# Patient Record
Sex: Female | Born: 1965 | Race: Black or African American | Hispanic: No | Marital: Single | Smoking: Never smoker
Health system: Southern US, Community
[De-identification: ages and names within clinical notes are randomized; demographics above are authoritative.]

## PROBLEM LIST (undated history)

## (undated) DIAGNOSIS — M5116 Intervertebral disc disorders with radiculopathy, lumbar region: Secondary | ICD-10-CM

## (undated) DIAGNOSIS — S83209A Unspecified tear of unspecified meniscus, current injury, unspecified knee, initial encounter: Secondary | ICD-10-CM

## (undated) DIAGNOSIS — M1990 Unspecified osteoarthritis, unspecified site: Secondary | ICD-10-CM

## (undated) DIAGNOSIS — Z8719 Personal history of other diseases of the digestive system: Secondary | ICD-10-CM

## (undated) DIAGNOSIS — M722 Plantar fascial fibromatosis: Secondary | ICD-10-CM

## (undated) HISTORY — PX: LAPAROSCOPIC GASTRIC BANDING: SHX1100

## (undated) HISTORY — DX: Plantar fascial fibromatosis: M72.2

## (undated) HISTORY — DX: Unspecified osteoarthritis, unspecified site: M19.90

---

## 2000-08-09 HISTORY — PX: TUBAL LIGATION: SHX77

## 2001-07-25 ENCOUNTER — Encounter: Admission: RE | Admit: 2001-07-25 | Discharge: 2001-10-23 | Payer: Self-pay | Admitting: Orthopedic Surgery

## 2005-08-09 HISTORY — PX: TOTAL ABDOMINAL HYSTERECTOMY W/ BILATERAL SALPINGOOPHORECTOMY: SHX83

## 2007-08-10 HISTORY — PX: CHOLECYSTECTOMY: SHX55

## 2008-08-09 HISTORY — PX: APPENDECTOMY: SHX54

## 2010-04-09 ENCOUNTER — Encounter: Admission: RE | Admit: 2010-04-09 | Discharge: 2010-04-09 | Payer: Self-pay | Admitting: Internal Medicine

## 2011-12-10 ENCOUNTER — Encounter (INDEPENDENT_AMBULATORY_CARE_PROVIDER_SITE_OTHER): Payer: Self-pay | Admitting: General Surgery

## 2011-12-10 ENCOUNTER — Ambulatory Visit (INDEPENDENT_AMBULATORY_CARE_PROVIDER_SITE_OTHER): Payer: PRIVATE HEALTH INSURANCE | Admitting: General Surgery

## 2011-12-10 VITALS — BP 166/92 | HR 92 | Temp 97.4°F | Resp 20 | Ht 67.0 in | Wt 346.2 lb

## 2011-12-10 DIAGNOSIS — Z9884 Bariatric surgery status: Secondary | ICD-10-CM | POA: Insufficient documentation

## 2011-12-10 NOTE — Progress Notes (Signed)
Patient ID: Lauren Keith, female   DOB: Oct 03, 1965, 47 y.o.   MRN: [id number]  Chief Complaint  Patient presents with  . Lap Band Fill    new pt- transfer of care    HPI Lauren Keith is a 46 y.o. female.   HPI 46 year old morbidly obese African American female comes in to transfer her bariatric care from [facility]. She states that is just very inconvenient to drive back and forth to [facility]. She states that they also only do adjustments on Tuesdays which is not conducive to her schedule.  The patient underwent laparoscopic adjustable gastric band placement in September 2008 by Dr. Augustine Radar. She had an AP L. band placed. She apparently lost a fair amount of weight very quickly. It became difficult to aspirate her port; therefore, her port was removed, replaced and relocated to her left upper quadrant. Her preoperative weight was 356 pounds. She apparently lost up to 50 pounds initially. She started having some swallowing problems in July 2011. Her band was deflated. Reportedly an upper GI was within normal limits. Her band was slowly refilled. One clinic note from October 2011 revealed a weight of 313 pounds. Her last adjustment was at that time. She had around 8 cc in her band. She was last seen by Dr. Augustine Radar in January 2013. At that time she had regained a large amount of weight. Her weight at that visit was 336 pounds.  She states that she has no restrictions currently. She denies any heartburn or reflux. She denies any morning or nighttime cough. She will only regurgitate if she eats too fast. She has problems with certain foods such as beef or dry chicken. She is taking a multivitamin on a daily basis. She is currently not engaging in any form of exercise. She attributes this to bilateral knee pain and bilateral plantar fasciitis. She reports having a bowel movement every day. She has also been having some intermittent left-sided abdominal wall pain. This generally occurs about once a  week. It lasts for about 3-4 minutes. She describes it as sharp.  She states that she has been making some poor food choices. She continues to drink [facility] and eats a large amount of carbohydrates. Past Medical History  Diagnosis Date  . Arthritis   . Hypertension   . Sleep apnea   . Plantar fasciitis, bilateral   . Knee pain     bilateral    Past Surgical History  Procedure Date  . Abdominal hysterectomy   . Cholecystectomy   . Appendectomy   . Laparoscopic gastric banding 04/2007    APL; [facility]  . Lap band port revision 2009    [facility]    Family History  Problem Relation Age of Onset  . Hypertension Mother   . Diabetes Mother   . Cancer Mother     breast  . Heart disease Father   . Hypertension Father   . Hypertension Brother   . Cancer Paternal Grandmother     ovarian    Social History History  Substance Use Topics  . Smoking status: Never Smoker   . Smokeless tobacco: Not on file  . Alcohol Use: Yes    No Known Allergies  Current Outpatient Prescriptions  Medication Sig Dispense Refill  . ALPRAZolam (XANAX) 1 MG tablet Take 1 mg by mouth at bedtime as needed.      . Diclofenac-Misoprostol 75-0.2 MG TBEC Take by mouth.      . DULoxetine (CYMBALTA) 60 MG capsule  Take 60 mg by mouth daily.      Marland Kitchen ibuprofen (ADVIL,MOTRIN) 600 MG tablet Take 600 mg by mouth every 6 (six) hours as needed.      Marland Kitchen lisinopril-hydrochlorothiazide (PRINZIDE,ZESTORETIC) 20-25 MG per tablet Take 1 tablet by mouth daily.      . Multiple Vitamin (MULTIVITAMIN) capsule Take 1 capsule by mouth daily.        Review of Systems Review of Systems  Constitutional: Positive for unexpected weight change (wt gain 30 lbs). Negative for fever and chills.  HENT: Negative for neck pain and neck stiffness.   Eyes: Negative for photophobia and visual disturbance.  Respiratory: Negative for chest tightness, shortness of breath and wheezing.   Cardiovascular: Negative for chest pain and leg swelling.    Gastrointestinal: Positive for abdominal pain. Negative for nausea, vomiting, diarrhea, blood in stool and abdominal distention.       About once a week Left sided abdominal pain, last 3-4 minutes, sharp, no radiation.  Genitourinary: Negative for dysuria, hematuria and difficulty urinating.  Musculoskeletal:       B/l knee pain which has worsened since gained wt; b/l plantar fasciitis  Skin: Negative.   Neurological: Negative for seizures, facial asymmetry and headaches.  Hematological: Negative for adenopathy.  Psychiatric/Behavioral: Negative for behavioral problems.    Blood pressure 166/92, pulse 92, temperature 97.4 F (36.3 C), temperature source Temporal, resp. rate 20, height 5\' 7"  (1.702 m), weight 346 lb 3.2 oz (157.035 kg).  Physical Exam Physical Exam  Vitals reviewed. Constitutional: She is oriented to person, place, and time. She appears well-developed and well-nourished. No distress.       Morbidly obese  HENT:  Head: Normocephalic and atraumatic.  Eyes: Conjunctivae are normal. No scleral icterus.  Neck: Normal range of motion. Neck supple. No tracheal deviation present. No thyromegaly present.  Cardiovascular: Normal rate, regular rhythm and normal heart sounds.   Pulmonary/Chest: Effort normal and breath sounds normal. No respiratory distress. She has no wheezes.  Abdominal: Soft. Bowel sounds are normal. She exhibits no distension. There is no tenderness.         Multiple healed trocar sites; port in LUQ, inferior and lateral to LUQ subcostal incision  Musculoskeletal: She exhibits tenderness (some b/l knee tenderness). She exhibits no edema.  Neurological: She is alert and oriented to person, place, and time. She exhibits normal muscle tone.  Skin: Skin is warm and dry. No rash noted. She is not diaphoretic. No erythema.  Psychiatric: She has a normal mood and affect. Her behavior is normal. Judgment and thought content normal.    Data Reviewed [facility] Op note  2008 [facility] clinic note 08/31/11; 05/2010  Assessment    Morbid obesity BMI 54.2 B/l Knee pain OSA Hypertension     Plan     The patient unfortunately is almost back to her preoperative weight. We discussed the importance of routine followup for the LapBand to be successful. I also emphasized the importance of diet and portion control. We rediscussed how the band is simply a tool to help her lose weight. We discussed the importance of chewing very thoroughly and waiting one to 2 minutes between bites of food.  We also discussed the importance of exercise. I explained to her our BELT program as well as suggesting her finding a gym that has a pool where she might be able to do water aerobics.  Since she has no restriction, I recommended performing a band adjustment today.  After  obtaining verbal consent, the LUQ abdominal wall was prepped with Chloraprep. The port was accessed on the 3rd attempt with a Huber needle and 1.5 cc of saline was added to give the patient an expected fill volume of 9.5 cc.  The patient was able to tolerate sips of water.  She was given post fill band instructions. She was instructed on what to call for.  We will also refer her to the nutritionist for LapBand refresher course. We will also obtain an upper GI to evaluate her band.  Hopefully we can try to salvage her LapBand. If not we will discuss revision to other procedures.  Followup 4-6 weeks.  Mary Sella. Andrey Campanile, MD, FACS General, Bariatric, & Minimally Invasive Surgery [facility], [place]         [facility] M 12/10/2011, 6:02 PM

## 2011-12-10 NOTE — Patient Instructions (Signed)
Eat small bites, chew thoroughly, wait 1-2 minutes between bites.  Try some form of exercising, like water aerobics  1. Stay on liquids for the next 2 days as you adapt to your new fill volume.  Then resume your previous diet. 2. Decreasing your carbohydrate intake will hasten you weight loss.  Rely more on proteins for your meals.  Avoid condiments that contain sweets such as Honey Mustard and sugary salad dressings.   3. Stay in the "green zone".  If you are regurgitating with meals, having night time reflux, and find yourself eating soft comfort foods (mashed potatoes, potato chips)...realize that you are developing "maladaptive eating".  You will not lose weight this way and may regain weight.  The GREEN ZONE is eating smaller portions and not regurgitating.  Hence we may need to withdraw fluid from your band. 4. Build exercise into your daily routine.  Walking is the best way to start but do something every day if you can.

## 2011-12-13 NOTE — Progress Notes (Signed)
Addended byLiliana Cline on: 12/13/2011 07:48 AM   Modules accepted: Orders

## 2011-12-15 ENCOUNTER — Other Ambulatory Visit: Payer: Self-pay

## 2011-12-16 ENCOUNTER — Ambulatory Visit
Admission: RE | Admit: 2011-12-16 | Discharge: 2011-12-16 | Disposition: A | Discharge status: Home / Self Care | Payer: PRIVATE HEALTH INSURANCE | Source: Ambulatory Visit | Attending: General Surgery | Admitting: General Surgery

## 2011-12-16 DIAGNOSIS — Z9884 Bariatric surgery status: Secondary | ICD-10-CM

## 2011-12-17 ENCOUNTER — Other Ambulatory Visit: Payer: Self-pay

## 2011-12-21 ENCOUNTER — Telehealth (INDEPENDENT_AMBULATORY_CARE_PROVIDER_SITE_OTHER): Payer: Self-pay | Admitting: General Surgery

## 2011-12-21 NOTE — Telephone Encounter (Signed)
Called patient. Requested sooner appt. Appt moved sooner for patient to see [facility] Thursday 12/23/11 @ 10:30 am.

## 2011-12-21 NOTE — Telephone Encounter (Signed)
Message copied by Liliana Cline on Tue Dec 21, 2011  1:08 PM ------      Message from: Leandrew Koyanagi      Created: Mon Dec 20, 2011  3:35 PM      Regarding: Lap Band Adjustment       I received the following email from the patient. I have emailed the diet and BELT program information to her. Please let the patient know of anything further she needs to do concerning the band adjustment.      Thanks,      Okey Regal            From: Jethro Bolus [[email or web address]]      Sent: Monday, Dec 20, 2011 3:22 PM      To: Leandrew Koyanagi      Subject: Re: FORMS                  That is fine I have been there and had a fill. But I do have a question. When they first done the fill I had some restriction and know it has been alittle over a week going on 2 this coming Friday and I don't fill as much of a restriction as I did the first feww days after the fill. What do I need to do? Also do you have a 1200 to 1500 calorie diet that they use. Can you email it to me. That is what he sayes he wants me to eat. Also he said something about going to [facility] to do exercise and he did not give me that information either.            Thanks Okey Regal for all your help.

## 2011-12-23 ENCOUNTER — Encounter (INDEPENDENT_AMBULATORY_CARE_PROVIDER_SITE_OTHER): Payer: Self-pay

## 2011-12-23 ENCOUNTER — Encounter (INDEPENDENT_AMBULATORY_CARE_PROVIDER_SITE_OTHER): Payer: PRIVATE HEALTH INSURANCE

## 2011-12-23 ENCOUNTER — Ambulatory Visit (INDEPENDENT_AMBULATORY_CARE_PROVIDER_SITE_OTHER): Payer: PRIVATE HEALTH INSURANCE | Admitting: Physician Assistant

## 2011-12-23 DIAGNOSIS — Z4651 Encounter for fitting and adjustment of gastric lap band: Secondary | ICD-10-CM

## 2011-12-23 NOTE — Patient Instructions (Signed)
Take clear liquids tonight. Thin protein shakes are ok to start tomorrow morning. Slowly advance your diet thereafter. Call us if you have persistent vomiting or regurgitation, night cough or reflux symptoms. Return as scheduled or sooner if you notice no changes in hunger/portion sizes.  

## 2011-12-23 NOTE — Progress Notes (Signed)
  HISTORY: Lauren Keith is a 46 y.o.female who received an AP-Large lap-band in September 2008 by Dr. Augustine Radar at [facility]. She has had her care transferred here. She comes in with persistently larger than desired portions, but she has lost nine pounds since earlier this month. She denies persistent regurgitation or reflux.  VITAL SIGNS: Filed Vitals:   12/23/11 1050  BP: 148/88  Pulse: 80  Temp: 98.9 F (37.2 C)  Resp: 16    PHYSICAL EXAM: Physical exam reveals a very well-appearing 46 y.o.female in no apparent distress Neurologic: Awake, alert, oriented Psych: Bright affect, conversant Respiratory: Breathing even and unlabored. No stridor or wheezing Abdomen: Soft, nontender, nondistended to palpation. Incisions well-healed. No incisional hernias. Port easily palpated. Extremities: Atraumatic, good range of motion.  ASSESMENT: 46 y.o.  female  s/p AP-Large lap-band.   PLAN: The patient's port was accessed with a 20G Huber needle without difficulty. Clear fluid was aspirated and 0.5 mL saline was added to the port to give a total predicted volume of 10 mL. The patient was able to swallow water without difficulty following the procedure and was instructed to take clear liquids for the next 24-48 hours and advance slowly as tolerated.

## 2011-12-27 ENCOUNTER — Telehealth (INDEPENDENT_AMBULATORY_CARE_PROVIDER_SITE_OTHER): Payer: Self-pay | Admitting: General Surgery

## 2011-12-27 NOTE — Telephone Encounter (Signed)
Pt states she had lap band fill last Thursday.  Over week-end noted food "sitting in the back of throat."  Asking for appt to have fill removed.  While working with staff to work her in today, she spoke with her PCP as well.  According to pt when called back, she has not been taking her Cymbalta, and a side-effect of abruptly not taking med is same as she is experiencing.  Pt wishes to resume medication and wait to see if she needs to come in or not for lap band adjustment.

## 2012-02-03 ENCOUNTER — Encounter (INDEPENDENT_AMBULATORY_CARE_PROVIDER_SITE_OTHER): Payer: PRIVATE HEALTH INSURANCE

## 2012-02-04 ENCOUNTER — Encounter (INDEPENDENT_AMBULATORY_CARE_PROVIDER_SITE_OTHER): Payer: Self-pay

## 2012-02-09 ENCOUNTER — Encounter (INDEPENDENT_AMBULATORY_CARE_PROVIDER_SITE_OTHER): Payer: PRIVATE HEALTH INSURANCE | Admitting: General Surgery

## 2012-02-09 ENCOUNTER — Encounter (INDEPENDENT_AMBULATORY_CARE_PROVIDER_SITE_OTHER): Payer: Self-pay | Admitting: Physician Assistant

## 2012-02-09 ENCOUNTER — Other Ambulatory Visit: Payer: Self-pay | Admitting: Otolaryngology

## 2012-02-14 ENCOUNTER — Encounter (HOSPITAL_BASED_OUTPATIENT_CLINIC_OR_DEPARTMENT_OTHER): Payer: Self-pay | Admitting: *Deleted

## 2012-02-15 ENCOUNTER — Encounter (HOSPITAL_BASED_OUTPATIENT_CLINIC_OR_DEPARTMENT_OTHER): Payer: Self-pay | Admitting: *Deleted

## 2012-02-15 NOTE — Progress Notes (Addendum)
NPO AFTER MN WITH EXCEPTION CLEAR LIQUIDS UNTIL 0700. ARRIVES AT 1100. NEEDS ISTAT AND EKG. PT STATES POSS. RESCHEDULE. GAVE INSTRUCTIONS NPO AFTER MN WITH EXCEPTION CLEAR LIQUIDS UP UNTIL 6 HRS PRIOR TO SURGERY TIME.  PT CASE MOVED TO Friday 02-25-2012. LM ON PT CELL #. ARRIVE AT 1200. CLEAR LIQUIDS UNTIL 0800.

## 2012-02-24 ENCOUNTER — Ambulatory Visit: Payer: PRIVATE HEALTH INSURANCE | Admitting: *Deleted

## 2012-02-25 ENCOUNTER — Ambulatory Visit (HOSPITAL_BASED_OUTPATIENT_CLINIC_OR_DEPARTMENT_OTHER): Payer: PRIVATE HEALTH INSURANCE | Admitting: Certified Registered"

## 2012-02-25 ENCOUNTER — Ambulatory Visit (HOSPITAL_BASED_OUTPATIENT_CLINIC_OR_DEPARTMENT_OTHER)
Admission: RE | Admit: 2012-02-25 | Discharge: 2012-02-25 | Disposition: A | Discharge status: Home / Self Care | Payer: PRIVATE HEALTH INSURANCE | Source: Ambulatory Visit | Attending: Specialist | Admitting: Specialist

## 2012-02-25 ENCOUNTER — Encounter (HOSPITAL_BASED_OUTPATIENT_CLINIC_OR_DEPARTMENT_OTHER)
Admission: RE | Disposition: A | Discharge status: Home / Self Care | Payer: Self-pay | Source: Ambulatory Visit | Attending: Specialist

## 2012-02-25 ENCOUNTER — Encounter (HOSPITAL_BASED_OUTPATIENT_CLINIC_OR_DEPARTMENT_OTHER): Payer: Self-pay | Admitting: Certified Registered"

## 2012-02-25 ENCOUNTER — Encounter (HOSPITAL_BASED_OUTPATIENT_CLINIC_OR_DEPARTMENT_OTHER): Payer: Self-pay | Admitting: *Deleted

## 2012-02-25 DIAGNOSIS — M23305 Other meniscus derangements, unspecified medial meniscus, unspecified knee: Secondary | ICD-10-CM | POA: Insufficient documentation

## 2012-02-25 DIAGNOSIS — M23302 Other meniscus derangements, unspecified lateral meniscus, unspecified knee: Secondary | ICD-10-CM | POA: Insufficient documentation

## 2012-02-25 DIAGNOSIS — M1710 Unilateral primary osteoarthritis, unspecified knee: Secondary | ICD-10-CM | POA: Insufficient documentation

## 2012-02-25 HISTORY — PX: KNEE ARTHROSCOPY: SHX127

## 2012-02-25 HISTORY — DX: Unspecified tear of unspecified meniscus, current injury, unspecified knee, initial encounter: S83.209A

## 2012-02-25 HISTORY — DX: Personal history of other diseases of the digestive system: Z87.19

## 2012-02-25 LAB — POCT I-STAT 4, (NA,K, GLUC, HGB,HCT): Sodium: 144 mEq/L (ref 135–145)

## 2012-02-25 SURGERY — ARTHROSCOPY, KNEE
Anesthesia: General | Site: Knee | Laterality: Right | Wound class: Clean

## 2012-02-25 MED ORDER — CEFAZOLIN SODIUM-DEXTROSE 2-3 GM-% IV SOLR
2.0000 g | INTRAVENOUS | Status: AC
  Administered 2012-02-25: 2 g via INTRAVENOUS

## 2012-02-25 MED ORDER — LIDOCAINE HCL (CARDIAC) 20 MG/ML IV SOLN
INTRAVENOUS | Status: DC | PRN
  Administered 2012-02-25: 80 mg via INTRAVENOUS

## 2012-02-25 MED ORDER — CHLORHEXIDINE GLUCONATE 4 % EX LIQD: 60.0000 mL | Freq: Once | CUTANEOUS | Status: DC

## 2012-02-25 MED ORDER — EPINEPHRINE HCL 1 MG/ML IJ SOLN
INTRAMUSCULAR | Status: DC | PRN
  Administered 2012-02-25: 2 mg

## 2012-02-25 MED ORDER — SODIUM CHLORIDE 0.9 % IR SOLN
Status: DC | PRN
  Administered 2012-02-25: 9000 mL

## 2012-02-25 MED ORDER — LACTATED RINGERS IV SOLN: INTRAVENOUS | Status: DC

## 2012-02-25 MED ORDER — FENTANYL CITRATE 0.05 MG/ML IJ SOLN: 25.0000 ug | INTRAMUSCULAR | Status: DC | PRN

## 2012-02-25 MED ORDER — ONDANSETRON HCL 4 MG/2ML IJ SOLN
INTRAMUSCULAR | Status: DC | PRN
  Administered 2012-02-25: 4 mg via INTRAVENOUS

## 2012-02-25 MED ORDER — OXYCODONE-ACETAMINOPHEN 5-325 MG PO TABS: 1.0000 | ORAL_TABLET | ORAL | Status: AC | PRN

## 2012-02-25 MED ORDER — MIDAZOLAM HCL 5 MG/5ML IJ SOLN
INTRAMUSCULAR | Status: DC | PRN
  Administered 2012-02-25: 2 mg via INTRAVENOUS

## 2012-02-25 MED ORDER — LACTATED RINGERS IV SOLN
INTRAVENOUS | Status: DC
  Administered 2012-02-25: 100 mL/h via INTRAVENOUS

## 2012-02-25 MED ORDER — FENTANYL CITRATE 0.05 MG/ML IJ SOLN
INTRAMUSCULAR | Status: DC | PRN
  Administered 2012-02-25: 100 ug via INTRAVENOUS
  Administered 2012-02-25 (×2): 50 ug via INTRAVENOUS

## 2012-02-25 MED ORDER — DEXAMETHASONE SODIUM PHOSPHATE 4 MG/ML IJ SOLN
INTRAMUSCULAR | Status: DC | PRN
  Administered 2012-02-25: 8 mg via INTRAVENOUS

## 2012-02-25 MED ORDER — PROPOFOL 10 MG/ML IV EMUL
INTRAVENOUS | Status: DC | PRN
  Administered 2012-02-25: 250 mg via INTRAVENOUS

## 2012-02-25 SURGICAL SUPPLY — 37 items
BANDAGE ELASTIC 6 VELCRO ST LF (GAUZE/BANDAGES/DRESSINGS) ×2 IMPLANT
BLADE CUDA SHAVER 3.5 (BLADE) ×2 IMPLANT
BOOTIES KNEE HIGH SLOAN (MISCELLANEOUS) ×2 IMPLANT
CANISTER SUCT LVC 12 LTR MEDI- (MISCELLANEOUS) ×2 IMPLANT
CLOTH BEACON ORANGE TIMEOUT ST (SAFETY) ×2 IMPLANT
DRAPE ARTHROSCOPY W/POUCH 114 (DRAPES) ×2 IMPLANT
DURAPREP 26ML APPLICATOR (WOUND CARE) ×2 IMPLANT
GLOVE BIOGEL M 6.5 STRL (GLOVE) ×1 IMPLANT
GLOVE BIOGEL M STRL SZ7.5 (GLOVE) ×3 IMPLANT
GLOVE BIOGEL PI IND STRL 7.5 (GLOVE) IMPLANT
GLOVE BIOGEL PI INDICATOR 7.5 (GLOVE) ×1
GLOVE ECLIPSE 6.0 STRL STRAW (GLOVE) ×1 IMPLANT
GLOVE SURG SS PI 8.0 STRL IVOR (GLOVE) ×2 IMPLANT
GOWN PREVENTION PLUS LG XLONG (DISPOSABLE) ×2 IMPLANT
GOWN STRL NON-REIN LRG LVL3 (GOWN DISPOSABLE) ×1 IMPLANT
GOWN STRL REIN XL XLG (GOWN DISPOSABLE) ×2 IMPLANT
IV NS IRRIG 3000ML ARTHROMATIC (IV SOLUTION) ×4 IMPLANT
KNEE WRAP E Z 3 GEL PACK (MISCELLANEOUS) ×2 IMPLANT
LEGGING LITHOTOMY PAIR STRL (DRAPES) ×1 IMPLANT
NDL FILTER BLUNT 18X1 1/2 (NEEDLE) ×1 IMPLANT
NDL SAFETY ECLIPSE 18X1.5 (NEEDLE) ×2 IMPLANT
NEEDLE FILTER BLUNT 18X 1/2SAF (NEEDLE) ×1
NEEDLE FILTER BLUNT 18X1 1/2 (NEEDLE) ×1 IMPLANT
NEEDLE HYPO 18GX1.5 SHARP (NEEDLE) ×4
PACK ARTHROSCOPY DSU (CUSTOM PROCEDURE TRAY) ×2 IMPLANT
PACK BASIN DAY SURGERY FS (CUSTOM PROCEDURE TRAY) ×2 IMPLANT
PAD CAST 4YDX4 CTTN HI CHSV (CAST SUPPLIES) IMPLANT
PADDING CAST COTTON 4X4 STRL (CAST SUPPLIES) ×2
PADDING CAST COTTON 6X4 STRL (CAST SUPPLIES) ×2 IMPLANT
SET ARTHROSCOPY TUBING (MISCELLANEOUS) ×2
SET ARTHROSCOPY TUBING LN (MISCELLANEOUS) ×1 IMPLANT
SPONGE GAUZE 4X4 12PLY (GAUZE/BANDAGES/DRESSINGS) ×2 IMPLANT
SUT ETHILON 4 0 PS 2 18 (SUTURE) ×2 IMPLANT
SYR 30ML LL (SYRINGE) ×2 IMPLANT
SYRINGE 10CC LL (SYRINGE) ×2 IMPLANT
TOWEL OR 17X24 6PK STRL BLUE (TOWEL DISPOSABLE) ×2 IMPLANT
WATER STERILE IRR 500ML POUR (IV SOLUTION) ×2 IMPLANT

## 2012-02-25 NOTE — Brief Op Note (Signed)
02/25/2012  3:46 PM  PATIENT:  Lauren Keith  46 y.o. female  PRE-OPERATIVE DIAGNOSIS:  LATERAL MENISCAL TEAR RIGHT KNEE  POST-OPERATIVE DIAGNOSIS:  LATERAL MENISCAL TEAR RIGHT KNEE  PROCEDURE:  Procedure(s) (LRB): ARTHROSCOPY KNEE (Right)  SURGEON:  Surgeon(s) and Role:    * Javier Docker, MD - Primary  PHYSICIAN ASSISTANT:   ASSISTANTS: none   ANESTHESIA:   general  EBL:  Total I/O In: 100 [I.V.:100] Out: -   BLOOD ADMINISTERED:none  DRAINS: none   LOCAL MEDICATIONS USED:  MARCAINE     SPECIMEN:  No Specimen  DISPOSITION OF SPECIMEN:  N/A  COUNTS:  YES  TOURNIQUET:  * No tourniquets in log *  DICTATION: .Other Dictation: Dictation Number [id number]  PLAN OF CARE: Discharge to home after PACU  PATIENT DISPOSITION:  PACU - hemodynamically stable.   Delay start of Pharmacological VTE agent (>24hrs) due to surgical blood loss or risk of bleeding: no

## 2012-02-25 NOTE — Anesthesia Preprocedure Evaluation (Signed)
Anesthesia Evaluation  Patient identified by MRN, date of birth, ID band Patient awake    Reviewed: Allergy & Precautions, H&P , NPO status , Patient's Chart, lab work & pertinent test results  Airway Mallampati: III TM Distance: >3 FB Neck ROM: full    Dental No notable dental hx. (+) Teeth Intact and Dental Advisory Given   Pulmonary neg pulmonary ROS, sleep apnea and Continuous Positive Airway Pressure Ventilation ,  breath sounds clear to auscultation  Pulmonary exam normal       Cardiovascular Exercise Tolerance: Good hypertension, Pt. on medications negative cardio ROS  Rhythm:regular Rate:Normal     Neuro/Psych negative neurological ROS  negative psych ROS   GI/Hepatic negative GI ROS, Neg liver ROS,   Endo/Other  negative endocrine ROSMorbid obesity  Renal/GU negative Renal ROS  negative genitourinary   Musculoskeletal   Abdominal   Peds  Hematology negative hematology ROS (+)   Anesthesia Other Findings   Reproductive/Obstetrics negative OB ROS                           Anesthesia Physical Anesthesia Plan  ASA: III  Anesthesia Plan: General   Post-op Pain Management:    Induction: Intravenous  Airway Management Planned: LMA  Additional Equipment:   Intra-op Plan:   Post-operative Plan:   Informed Consent: I have reviewed the patients History and Physical, chart, labs and discussed the procedure including the risks, benefits and alternatives for the proposed anesthesia with the patient or authorized representative who has indicated his/her understanding and acceptance.   Dental Advisory Given  Plan Discussed with: CRNA and Surgeon  Anesthesia Plan Comments:         Anesthesia Quick Evaluation

## 2012-02-25 NOTE — Transfer of Care (Signed)
Immediate Anesthesia Transfer of Care Note  Patient: Lauren Keith  Procedure(s) Performed: Procedure(s) (LRB): ARTHROSCOPY KNEE (Right)  Patient Location: PACU  Anesthesia Type: General  Level of Consciousness: awake, oriented, sedated and patient cooperative  Airway & Oxygen Therapy: Patient Spontanous Breathing and Patient connected to face mask oxygen  Post-op Assessment: Report given to PACU RN and Post -op Vital signs reviewed and stable  Post vital signs: Reviewed and stable  Complications: No apparent anesthesia complications

## 2012-02-25 NOTE — Anesthesia Postprocedure Evaluation (Signed)
  Anesthesia Post-op Note  Patient: Lauren Keith  Procedure(s) Performed: Procedure(s) (LRB): ARTHROSCOPY KNEE (Right)  Patient Location: PACU  Anesthesia Type: General  Level of Consciousness: awake and alert   Airway and Oxygen Therapy: Patient Spontanous Breathing  Post-op Pain: mild  Post-op Assessment: Post-op Vital signs reviewed, Patient's Cardiovascular Status Stable, Respiratory Function Stable, Patent Airway and No signs of Nausea or vomiting  Post-op Vital Signs: stable  Complications: No apparent anesthesia complications

## 2012-02-25 NOTE — H&P (Signed)
Lauren Keith is an 46 y.o. female.   Chief Complaint: Right knee pain HPI: Meniscus tear right knee  Past Medical History  Diagnosis Date  . Hypertension   . Plantar fasciitis, bilateral   . Acute meniscal tear of knee left knee  . Arthritis KNEES AND FEET  . OSA on CPAP USES 2 TIMES WEEKLY  . History of gastroesophageal reflux (GERD)     Past Surgical History  Procedure Date  . Laparoscopic gastric banding 04/2007   [place]    APL;   AND PORT REMOVAL/ REPLACEMENT 2009  . Appendectomy 2010  . Cholecystectomy 2009  . Tubal ligation 2002  . Total abdominal hysterectomy w/ bilateral salpingoophorectomy 2007    W/ ANTERIOR AND POSTERIOR REPAIR    Family History  Problem Relation Age of Onset  . Hypertension Mother   . Diabetes Mother   . Cancer Mother     breast  . Heart disease Father   . Hypertension Father   . Hypertension Brother   . Cancer Paternal Grandmother     ovarian   Social History:  reports that she has never smoked. She has never used smokeless tobacco. She reports that she does not drink alcohol or use illicit drugs.  Allergies: No Known Allergies  Medications Prior to Admission  Medication Sig Dispense Refill  . Aspirin-Acetaminophen-Caffeine (GOODY HEADACHE PO) Take by mouth as needed.      . DULoxetine (CYMBALTA) 60 MG capsule Take 60 mg by mouth daily.      . fish oil-omega-3 fatty acids 1000 MG capsule Take 2 g by mouth daily.      Marland Kitchen ibuprofen (ADVIL,MOTRIN) 800 MG tablet Take 800 mg by mouth every 8 (eight) hours as needed.      Marland Kitchen lisinopril-hydrochlorothiazide (PRINZIDE,ZESTORETIC) 20-25 MG per tablet Take 1 tablet by mouth daily.      . Multiple Vitamin (MULTIVITAMIN) capsule Take 1 capsule by mouth daily.        Results for orders placed during the hospital encounter of 02/25/12 (from the past 48 hour(s))  POCT I-STAT 4, (NA,K, GLUC, HGB,HCT)     Status: Abnormal   Collection Time   02/25/12  2:06 PM      Component Value Range Comment     Sodium 144  135 - 145 mEq/L    Potassium 3.2 (*) 3.5 - 5.1 mEq/L    Glucose, Bld 89  70 - 99 mg/dL    HCT 16.1  09.6 - 04.5 %    Hemoglobin 14.3  12.0 - 15.0 g/dL    No results found.  Review of Systems  Musculoskeletal: Positive for joint pain.  All other systems reviewed and are negative.    Blood pressure 125/81, pulse 78, temperature 97.7 F (36.5 C), temperature source Oral, resp. rate 18, height 5\' 7"  (1.702 m), weight 145.151 kg (320 lb), SpO2 94.00%. Physical Exam  Vitals reviewed. Constitutional: She appears well-developed.  HENT:  Head: Normocephalic.  Eyes: Pupils are equal, round, and reactive to light.  Neck: Normal range of motion.  Cardiovascular: Normal rate.   Respiratory: Effort normal.  GI: Soft.  Musculoskeletal: She exhibits tenderness.       MJLT. Positive mcmurray.  Neurological: She is alert.  Skin: Skin is warm.  Psychiatric: She has a normal mood and affect.     Assessment/Plan DJD meniscus tear right knee. Plan Arthroscopy. Risks discussed.  Khalessi Blough C 02/25/2012, 2:38 PM

## 2012-02-25 NOTE — Anesthesia Procedure Notes (Signed)
Procedure Name: LMA Insertion Date/Time: 02/25/2012 3:05 PM Performed by: Renella Cunas D Pre-anesthesia Checklist: Patient identified, Emergency Drugs available, Suction available and Patient being monitored Patient Re-evaluated:Patient Re-evaluated prior to inductionOxygen Delivery Method: Circle System Utilized Preoxygenation: Pre-oxygenation with 100% oxygen Intubation Type: IV induction Ventilation: Mask ventilation without difficulty LMA: LMA inserted LMA Size: 4.0 Number of attempts: 1 Airway Equipment and Method: bite block Placement Confirmation: positive ETCO2 Tube secured with: Tape Dental Injury: Teeth and Oropharynx as per pre-operative assessment

## 2012-02-28 ENCOUNTER — Encounter (HOSPITAL_BASED_OUTPATIENT_CLINIC_OR_DEPARTMENT_OTHER): Payer: Self-pay | Admitting: Specialist

## 2012-02-28 NOTE — Op Note (Signed)
Lauren Keith, SPONSEL NO.:  [id number]  MEDICAL RECORD NO.:  [id number]  LOCATION:                               FACILITY:  [facility]  PHYSICIAN:  Jene Every, M.D.    DATE OF BIRTH:  11-Feb-1966  DATE OF PROCEDURE:  02/25/2012 DATE OF DISCHARGE:                              OPERATIVE REPORT   PREOPERATIVE DIAGNOSES: 1. Lateral meniscus tear, right knee with parameniscal cyst, medial     meniscus tear. 2. Degenerative joint disease of the right knee.  PROCEDURE PERFORMED: 1. Right knee arthroscopy. 2. Partial lateral meniscectomy. 3. Chondroplasty of the tibial plateau. 4. Drainage of parameniscal cyst. 5. Shaving of medial meniscus.  ANESTHESIA:  General.  ASSISTANT:  None.  HISTORY:  A 46 year old with knee pain, locking, popping, giving away of lateral meniscus noted and degenerative changes of the lateral compartment.  The meniscal cyst indicated for mechanical symptoms, partial meniscectomy.  Risk and benefits discussed including bleeding, infection, damage to neurovascular structures, no change in symptoms, worsening symptoms, DVT, PE, and anesthetic complications, etc.  TECHNIQUE:  With the patient in supine position, after induction of anesthesia, 2 g Kefzol, the right lower extremity prepped in usual sterile fashion.  A lateral parapatellar portal was fashioned with a #11 blade.  Ingress cannula atraumatically placed.  Irrigant was utilized to insufflate the joint.  Under direct visualization, medial parapatellar portal was fashioned with a #11 blade, after localization with 18-gauge needle sparing the medial meniscus.  Noted medial with some radial fraying, small tearing in medial meniscus, shaved this to a stable base. Femoral condyle and tibial plateau was unremarkable.  ACL was unremarkable.  Lateral compartment revealed a fairly moderate grade III changes of the tibial plateau.  Extensive tearing complex of the mid third of the meniscus.   Femoral condyle was unremarkable.  I introduced a basket and resected the meniscus to a stable base proximally.  The mid third was excised.  There was a cleavage noted.  This was probed, leading to the cyst, a white cystic fluid was evacuated.  Further contoured with a 3.5 Cuda shaver.  The remnant was stable to probe palpation.  Light chondroplasty performed of the tibia.  Suprapatellar pouch revealed normal patellofemoral tracking, no chondromalacia, gutters were unremarkable.  I revisited all the compartments, no further pathology, amenable to arthroscopic intervention.  Therefore, I removed all instrumentation. Portals were closed with 4-0 nylon simple sutures, and 0.25%Marcaine with epinephrine was infiltrated in the joint.  Wound was dressed sterilely.  Awoken without difficulty, and transported to the recovery room in satisfactory condition.  The patient tolerated the procedure well.  No complications.  No assistant.     Jene Every, M.D.     Cordelia Pen  D:  02/25/2012  T:  02/26/2012  Job:  [id number]

## 2012-03-01 ENCOUNTER — Encounter (HOSPITAL_BASED_OUTPATIENT_CLINIC_OR_DEPARTMENT_OTHER): Payer: Self-pay

## 2012-12-07 HISTORY — PX: LAPAROSCOPIC REPAIR AND REMOVAL OF GASTRIC BAND: SHX5919

## 2013-07-04 ENCOUNTER — Encounter (INDEPENDENT_AMBULATORY_CARE_PROVIDER_SITE_OTHER): Payer: Self-pay | Admitting: General Surgery

## 2013-07-04 ENCOUNTER — Encounter (INDEPENDENT_AMBULATORY_CARE_PROVIDER_SITE_OTHER): Payer: Self-pay

## 2013-07-04 ENCOUNTER — Ambulatory Visit (INDEPENDENT_AMBULATORY_CARE_PROVIDER_SITE_OTHER): Payer: Medicaid Other | Admitting: General Surgery

## 2013-07-04 VITALS — BP 128/80 | HR 72 | Resp 16 | Ht 67.0 in | Wt 333.8 lb

## 2013-07-04 DIAGNOSIS — Z6841 Body Mass Index (BMI) 40.0 and over, adult: Secondary | ICD-10-CM

## 2013-07-04 NOTE — Progress Notes (Signed)
Patient ID: Lauren Keith, female   DOB: 08-04-1966, 47 y.o.   MRN: [id number]  Chief Complaint  Patient presents with  . Bariatric Follow Up    HPI Lauren Keith is a 47 y.o. female.   HPI 47 year old morbidly obese African American female is referred back to [place] by Raenette Rover, PA C for consideration of revisional weight loss surgery. The patient initially underwent laparoscopic adjustable gastric band placement in 2008 [facility]. She had issues with her port and the port was repositioned in 2009. She did lose about 50 pounds. She moved away from the [facility] area and transferred her lapband care to our office. She saw [place] on 2 occasions and then canceled the remaining appointments she had. When asked about this she states that she was transferred to [facility] and it was not feasible for her to get up to [place]. She ended up having difficulty with her band with food intolerance it sounds like. She ended up going back to [facility] for followup and have the fluid removed from her lap band. She continued to have difficulty with oral intake. An x-ray was obtained which demonstrated proper positioning of the lap band. Because of ongoing symptoms she ultimately underwent removal of her lap band as well as lysis of adhesions on 12/27/2012 at [facility]. Since then she has regained about 30 pounds. She no longer has difficulty with oral intake. She states that she has to do something to help change her life. She states that she physically can't go on living like this. She states that she has severe musculoskeletal difficulty in her hips and knees. She states that her son plays recreational football and she has actually drive the car Onto the field because she cannot walk from the parking lot due to musculoskeletal pain. Currently she is getting appetite suppressant injections. She does use CPAP. She states that she wants to be around for her son and other children. She denies any acid reflux.  Despite numerous attempts were sustained weight loss and she has been unsuccessful. Past Medical History  Diagnosis Date  . Hypertension   . Plantar fasciitis, bilateral   . Acute meniscal tear of knee left knee  . Arthritis KNEES AND FEET  . OSA on CPAP USES 2 TIMES WEEKLY  . History of gastroesophageal reflux (GERD)     Past Surgical History  Procedure Laterality Date  . Laparoscopic gastric banding  04/2007   [place]    [facility];   AND PORT REMOVAL/ REPLACEMENT 2009  . Appendectomy  2010  . Cholecystectomy  2009  . Tubal ligation  2002  . Total abdominal hysterectomy w/ bilateral salpingoophorectomy  2007    W/ ANTERIOR AND POSTERIOR REPAIR  . Knee arthroscopy  02/25/2012    Procedure: ARTHROSCOPY KNEE;  Surgeon: Javier Docker, MD;  Location: [facility];  Service: Orthopedics;  Laterality: Right;  WITH DEBRIDEMENT AND PARTIAL LATERAL MENISECTOMY AND DRAINAGE OF CYST  . Laparoscopic repair and removal of gastric band  12/2012    lapband removal at [facility] History  Problem Relation Age of Onset  . Hypertension Mother   . Diabetes Mother   . Cancer Mother     breast  . Heart disease Father   . Hypertension Father   . Hypertension Brother   . Cancer Paternal Grandmother     ovarian    Social History History  Substance Use Topics  . Smoking status: Never Smoker   .  Smokeless tobacco: Never Used  . Alcohol Use: No    No Known Allergies  Current Outpatient Prescriptions  Medication Sig Dispense Refill  . Aspirin-Acetaminophen-Caffeine (GOODY HEADACHE PO) Take by mouth as needed.      . DULoxetine (CYMBALTA) 60 MG capsule Take 60 mg by mouth daily.      . fish oil-omega-3 fatty acids 1000 MG capsule Take 2 g by mouth daily.      Marland Kitchen ibuprofen (ADVIL,MOTRIN) 800 MG tablet Take 800 mg by mouth every 8 (eight) hours as needed.      Marland Kitchen lisinopril-hydrochlorothiazide (PRINZIDE,ZESTORETIC) 20-25 MG per tablet Take 1 tablet by mouth daily.      . Multiple  Vitamin (MULTIVITAMIN) capsule Take 1 capsule by mouth daily.       No current facility-administered medications for this visit.    Review of Systems Review of Systems  Constitutional: Negative for fever, activity change, appetite change and unexpected weight change.       Activity is severely limited by musculoskeletal disease in her hips and knees and low back pain  HENT: Negative for nosebleeds and trouble swallowing.   Eyes: Negative for photophobia and visual disturbance.  Respiratory: Negative for chest tightness and shortness of breath.        Positive OSA on CPAP  Cardiovascular: Negative for chest pain and leg swelling.       Denies CP, SOB, orthopnea, PND. +DOE. She can walk the isles in the grocery store without getting short of breath. She does get short of breath if she has to walk up stairs.  Her father and brother died from heart attacks.  Gastrointestinal: Negative for nausea, vomiting, abdominal pain, constipation and blood in stool.       Occasionally since having her gallbladder removed she will have to use the restroom very quickly after eating. Denies heartburn.  Genitourinary: Negative for dysuria and difficulty urinating.       She does report some intermittent leaks with coughing and sneezing  Musculoskeletal: Negative for arthralgias.  Skin: Negative for pallor and rash.  Neurological: Negative for dizziness, seizures, facial asymmetry and numbness.       Denies TIA and amaurosis fugax   Hematological: Negative for adenopathy. Does not bruise/bleed easily.  Psychiatric/Behavioral: Negative for behavioral problems and agitation.    Blood pressure 128/80, pulse 72, resp. rate 16, height 5\' 7"  (1.702 m), weight 333 lb 12.8 oz (151.411 kg).  Physical Exam Physical Exam  Vitals reviewed. Constitutional: She is oriented to person, place, and time. She appears well-developed and well-nourished. No distress.  Morbidly obese  HENT:  Head: Normocephalic and  atraumatic.  Right Ear: External ear normal.  Left Ear: External ear normal.  Eyes: Conjunctivae are normal. No scleral icterus.  Neck: Normal range of motion. Neck supple. No tracheal deviation present. No thyromegaly present.  Cardiovascular: Normal rate and normal heart sounds.   Pulmonary/Chest: Effort normal and breath sounds normal. No stridor. No respiratory distress. She has no wheezes.  Abdominal: Soft. She exhibits no distension. There is no tenderness. There is no rebound and no guarding. No hernia. Hernia confirmed negative in the ventral area.    Musculoskeletal: She exhibits no edema and no tenderness.  B/l knee crepitus  Lymphadenopathy:    She has no cervical adenopathy.  Neurological: She is alert and oriented to person, place, and time. She exhibits normal muscle tone.  Skin: Skin is warm and dry. No rash noted. She is not diaphoretic. No erythema.  Psychiatric: She  has a normal mood and affect. Her behavior is normal. Judgment and thought content normal.    Data Reviewed Office notes via care everywhere from Dr Augustine Radar, [facility] surgeon 10/03/12, 12/20/12, 01/09/13 OP note of lapband removal 12/27/12 Raenette Rover, PA-C office note 04/18/13 My office note  Assessment    Morbid obesity BMI  52 OSA onCPAP Dysmetabolic syndrome X Obesity hypoventilation syndrome B/l knee and hip OA Low back pain Hypothyroidism Hypertension    Plan    I had a very prolonged extensive discussion with the patient. The patient meets weight loss surgery criteria. I think the patient would be an acceptable candidate for Laparoscopic vertical sleeve gastrectomy.   We discussed laparoscopic sleeve gastrectomy. We discussed the preoperative, operative and postoperative process. Using diagrams, I explained the surgery in detail including the performance of an EGD near the end of the surgery and an Upper GI swallow study on POD 1. We discussed the typical hospital course including a 2-3 day stay  baring any complications.   The patient was given educational material. I quoted the patient that most patients can lose up to 50-70% of their excess weight. We did discuss the possibility of weight regain several years after the procedure.  The risks of infection, bleeding, pain, scarring, weight regain, too little or too much weight loss, vitamin deficiencies and need for lifelong vitamin supplementation, hair loss, need for protein supplementation, leaks, stricture, reflux, food intolerance, gallstone formation, hernia, need for reoperation and conversion to roux Y gastric bypass, need for open surgery, injury to spleen or surrounding structures, DVT's, PE, and death again discussed with the patient and the patient expressed understanding and desires to proceed with laparoscopic vertical sleeve gastrectomy, possible open, intraoperative endoscopy.  We discussed that before and after surgery that there would be an alteration in their diet. I explained that we have put them on a diet 2 weeks before surgery. I also explained that they would be on a liquid diet for 2 weeks after surgery. We discussed that they would have to avoid certain foods after surgery. We discussed the importance of physical activity as well as compliance with our dietary and supplement recommendations and routine follow-up.  I explained to the patient that we will start our evaluation process which includes labs, Upper GI to evaluate stomach and swallowing anatomy, nutritionist consultation, psychiatrist consultation, EKG, CXR.  I explained to the patient that she is at higher risk for bleeding as well as leak because this is a revisional surgery. I stated that her leak rate is higher than an initial bariatric surgery since she has had prior surgery around her fundus.  We also discussed laparoscopic Roux-en-Y gastric bypass. She was shown diagrams. We discussed how it differed from sleep gastrectomy. We discussed how postoperative  leaks are managed in sleep gastrectomies as well as and gastric bypass patients. We discussed that if a leak Was to occur that it can be problematic To manage, can be a protracted course, and potentially life-threatening.  At a minimum I told her I wanted her to watch our on-line seminar Re: bariatric surgery, and to really consider attending one of the support group meetings. We will obtain a copy of her most recent labs from her PCPs office and order what is missing from her standard initial evaluation. She seems motivated to make changes  A total of 60 minutes was spent with this patient more than 50% was spent in counseling Mary Sella. Andrey Campanile, MD, FACS General, Bariatric, & Minimally Invasive  Surgery [facility], [place]           [facility] M 07/04/2013, 1:10 PM

## 2013-07-04 NOTE — Patient Instructions (Signed)
Here is website address for the on-line seminar: [email or web address]  Please register and watch seminar We will start the evaluation.

## 2013-07-20 ENCOUNTER — Telehealth (INDEPENDENT_AMBULATORY_CARE_PROVIDER_SITE_OTHER): Payer: Self-pay | Admitting: General Surgery

## 2013-07-20 NOTE — Telephone Encounter (Signed)
I called pt to confirm to her that I would not be proceeding with her evaluation for revisional bariatric surgery. I tried to explain my rationale but the patient did not let me talk that much. I let her speak and share her concerns and frustration and disappointment that I had changed my mind. I tried to apologize to her and express my regret for initially offering to proceed with evaluating her for revisional surgery. When i started to try to explain my rationale after she had finished speaking, she hung the phone.   I had wanted to convey to her that after completing my office note and further thinking about her case and her elective surgical timeline I did not feel comfortable with operating on her. I also wanted to explain to her that she has an established Teacher, English as a foreign language at [employer] and she would be best served by going there. Unfortunately, she did not give me the opportunity to discuss my reasoning and thought process.   Mary Sella. Andrey Campanile, MD, FACS General, Bariatric, & Minimally Invasive Surgery [facility] Surgery, [place]

## 2013-07-24 ENCOUNTER — Encounter (INDEPENDENT_AMBULATORY_CARE_PROVIDER_SITE_OTHER): Payer: Self-pay

## 2014-04-01 ENCOUNTER — Ambulatory Visit: Payer: Self-pay | Admitting: Family Medicine

## 2015-01-28 ENCOUNTER — Encounter (HOSPITAL_BASED_OUTPATIENT_CLINIC_OR_DEPARTMENT_OTHER): Payer: Self-pay | Admitting: Physician Assistant

## 2015-01-28 DIAGNOSIS — I10 Essential (primary) hypertension: Secondary | ICD-10-CM | POA: Diagnosis present

## 2015-01-28 DIAGNOSIS — S83282A Other tear of lateral meniscus, current injury, left knee, initial encounter: Secondary | ICD-10-CM | POA: Diagnosis present

## 2015-01-28 DIAGNOSIS — Z9989 Dependence on other enabling machines and devices: Secondary | ICD-10-CM

## 2015-01-28 DIAGNOSIS — G4733 Obstructive sleep apnea (adult) (pediatric): Secondary | ICD-10-CM | POA: Diagnosis present

## 2015-01-28 DIAGNOSIS — S83209A Unspecified tear of unspecified meniscus, current injury, unspecified knee, initial encounter: Secondary | ICD-10-CM | POA: Diagnosis present

## 2015-01-28 DIAGNOSIS — M5116 Intervertebral disc disorders with radiculopathy, lumbar region: Secondary | ICD-10-CM | POA: Diagnosis present

## 2015-01-28 DIAGNOSIS — Z8719 Personal history of other diseases of the digestive system: Secondary | ICD-10-CM

## 2015-01-28 NOTE — H&P (Signed)
Lauren Keith is an 49 y.o. female.   Chief Complaint: left knee pain HPI: Lauren Keith is a 49 year old seen for persistent significant left knee pain.  She underwent an MRI of the left knee on 07-12-14 that showed a lateral meniscus tear.  She continues to have significant pain.  She had previously undergone right knee arthroscopy nine months ago from which she has done well.  She also has a lumbar left leg radiculopathy and has had two epidural injections with minimal relief.  She is having significant increased pain in her left knee, pain with weightbearing and activity, relieved by rest.   Past Medical History  Diagnosis Date  . Hypertension   . Plantar fasciitis, bilateral   . Acute meniscal tear of knee left knee  . Arthritis KNEES AND FEET  . OSA on CPAP USES 2 TIMES WEEKLY  . History of gastroesophageal reflux (GERD)   . Lumbar disc herniation with radiculopathy     Past Surgical History  Procedure Laterality Date  . Laparoscopic gastric banding  04/2007   [place]    [facility];   AND PORT REMOVAL/ REPLACEMENT 2009  . Appendectomy  2010  . Cholecystectomy  2009  . Tubal ligation  2002  . Total abdominal hysterectomy w/ bilateral salpingoophorectomy  2007    W/ ANTERIOR AND POSTERIOR REPAIR  . Knee arthroscopy  02/25/2012    Procedure: ARTHROSCOPY KNEE;  Surgeon: Javier Docker, MD;  Location: [facility];  Service: Orthopedics;  Laterality: Right;  WITH DEBRIDEMENT AND PARTIAL LATERAL MENISECTOMY AND DRAINAGE OF CYST  . Laparoscopic repair and removal of gastric band  12/2012    lapband removal at [facility] History  Problem Relation Age of Onset  . Hypertension Mother   . Diabetes Mother   . Cancer Mother     breast  . Heart disease Father   . Hypertension Father   . Hypertension Brother   . Cancer Paternal Grandmother     ovarian   Social History:  reports that she has never smoked. She has never used smokeless tobacco. She reports that she does  not drink alcohol or use illicit drugs.  Allergies: No Known Allergies  No current facility-administered medications for this encounter.  Current outpatient prescriptions:  .  Aspirin-Acetaminophen-Caffeine (GOODY HEADACHE PO), Take by mouth as needed., Disp: , Rfl:  .  DULoxetine (CYMBALTA) 60 MG capsule, Take 60 mg by mouth daily., Disp: , Rfl:  .  fish oil-omega-3 fatty acids 1000 MG capsule, Take 2 g by mouth daily., Disp: , Rfl:  .  ibuprofen (ADVIL,MOTRIN) 800 MG tablet, Take 800 mg by mouth every 8 (eight) hours as needed., Disp: , Rfl:  .  lisinopril-hydrochlorothiazide (PRINZIDE,ZESTORETIC) 20-25 MG per tablet, Take 1 tablet by mouth daily., Disp: , Rfl:  .  Multiple Vitamin (MULTIVITAMIN) capsule, Take 1 capsule by mouth daily., Disp: , Rfl:   No results found for this or any previous visit (from the past 48 hour(s)). No results found.  Review of Systems  Constitutional: Negative.   HENT: Negative.   Eyes: Negative.   Respiratory: Negative.   Cardiovascular: Negative.   Gastrointestinal: Negative.   Genitourinary: Negative.   Musculoskeletal: Positive for back pain and joint pain.  Skin: Negative.   Neurological: Negative.   Endo/Heme/Allergies: Negative.   Psychiatric/Behavioral: Negative.     Blood pressure 133/83, pulse 72, temperature 98.2 F (36.8 C), height  (1.702 m), weight 132.45 kg (292 lb). Physical Exam  Constitutional: She is oriented to person, place, and time. She appears well-developed and well-nourished.  HENT:  Head: Normocephalic and atraumatic.  Mouth/Throat: Oropharynx is clear and moist.  Eyes: Pupils are equal, round, and reactive to light.  Neck: Neck supple.  Cardiovascular: Normal rate.   Respiratory: Effort normal.  GI: Soft.  Genitourinary:  Not pertinent to current symptomatology therefore not examined.  Musculoskeletal:  Examination of her left knee reveals pain on the lateral joint line, positive lateral McMurray's, 1+  effusion.  Range of motion -10-120 degrees.  Knee is stable with normal patellar tracking.  Examination of her right knee reveals full range of motion without pain, swelling, weakness, or instability.  Vascular exam:  Pulses 2+ and symmetric. Patient also has significant low back pain with left L5 lumbar radiculopathy due to HNP  Neurological: She is alert and oriented to person, place, and time.  Skin: Skin is warm.  Psychiatric: She has a normal mood and affect.     Assessment Principal Problem:   Acute lateral meniscus tear of left knee Active Problems:   Hypertension   Acute meniscal tear of knee   OSA on CPAP   History of gastroesophageal reflux (GERD)   Lumbar disc herniation with radiculopathy  Plan Due to her significant pain and lack of response to conservative care, I recommend that we proceed with left knee arthroscopy.  Risks, complications, and benefits of the surgery have been described to her in detail and she understands this completely.    Prince Olivier J 01/28/2015, 3:38 PM

## 2015-01-31 ENCOUNTER — Encounter (HOSPITAL_BASED_OUTPATIENT_CLINIC_OR_DEPARTMENT_OTHER): Payer: Self-pay | Admitting: *Deleted

## 2015-02-07 ENCOUNTER — Ambulatory Visit (HOSPITAL_BASED_OUTPATIENT_CLINIC_OR_DEPARTMENT_OTHER): Payer: 59 | Admitting: Anesthesiology

## 2015-02-07 ENCOUNTER — Encounter (HOSPITAL_BASED_OUTPATIENT_CLINIC_OR_DEPARTMENT_OTHER): Payer: Self-pay | Admitting: Anesthesiology

## 2015-02-07 ENCOUNTER — Encounter (HOSPITAL_BASED_OUTPATIENT_CLINIC_OR_DEPARTMENT_OTHER)
Admission: RE | Disposition: A | Discharge status: Home / Self Care | Payer: Self-pay | Source: Ambulatory Visit | Attending: Orthopedic Surgery

## 2015-02-07 ENCOUNTER — Ambulatory Visit (HOSPITAL_BASED_OUTPATIENT_CLINIC_OR_DEPARTMENT_OTHER)
Admission: RE | Admit: 2015-02-07 | Discharge: 2015-02-07 | Disposition: A | Discharge status: Home / Self Care | Payer: 59 | Source: Ambulatory Visit | Attending: Orthopedic Surgery | Admitting: Orthopedic Surgery

## 2015-02-07 DIAGNOSIS — Z79899 Other long term (current) drug therapy: Secondary | ICD-10-CM | POA: Insufficient documentation

## 2015-02-07 DIAGNOSIS — Z9989 Dependence on other enabling machines and devices: Secondary | ICD-10-CM | POA: Insufficient documentation

## 2015-02-07 DIAGNOSIS — Z6841 Body Mass Index (BMI) 40.0 and over, adult: Secondary | ICD-10-CM | POA: Diagnosis not present

## 2015-02-07 DIAGNOSIS — S83242A Other tear of medial meniscus, current injury, left knee, initial encounter: Secondary | ICD-10-CM | POA: Insufficient documentation

## 2015-02-07 DIAGNOSIS — Y999 Unspecified external cause status: Secondary | ICD-10-CM | POA: Diagnosis not present

## 2015-02-07 DIAGNOSIS — M1990 Unspecified osteoarthritis, unspecified site: Secondary | ICD-10-CM | POA: Diagnosis not present

## 2015-02-07 DIAGNOSIS — Z7982 Long term (current) use of aspirin: Secondary | ICD-10-CM | POA: Insufficient documentation

## 2015-02-07 DIAGNOSIS — M5116 Intervertebral disc disorders with radiculopathy, lumbar region: Secondary | ICD-10-CM | POA: Diagnosis present

## 2015-02-07 DIAGNOSIS — Y939 Activity, unspecified: Secondary | ICD-10-CM | POA: Insufficient documentation

## 2015-02-07 DIAGNOSIS — I10 Essential (primary) hypertension: Secondary | ICD-10-CM | POA: Diagnosis not present

## 2015-02-07 DIAGNOSIS — G4733 Obstructive sleep apnea (adult) (pediatric): Secondary | ICD-10-CM | POA: Diagnosis not present

## 2015-02-07 DIAGNOSIS — S83282A Other tear of lateral meniscus, current injury, left knee, initial encounter: Secondary | ICD-10-CM | POA: Diagnosis not present

## 2015-02-07 DIAGNOSIS — Z8719 Personal history of other diseases of the digestive system: Secondary | ICD-10-CM

## 2015-02-07 DIAGNOSIS — Y929 Unspecified place or not applicable: Secondary | ICD-10-CM | POA: Insufficient documentation

## 2015-02-07 DIAGNOSIS — M2242 Chondromalacia patellae, left knee: Secondary | ICD-10-CM | POA: Diagnosis not present

## 2015-02-07 DIAGNOSIS — X58XXXA Exposure to other specified factors, initial encounter: Secondary | ICD-10-CM | POA: Diagnosis not present

## 2015-02-07 DIAGNOSIS — Z791 Long term (current) use of non-steroidal anti-inflammatories (NSAID): Secondary | ICD-10-CM | POA: Insufficient documentation

## 2015-02-07 DIAGNOSIS — S83209A Unspecified tear of unspecified meniscus, current injury, unspecified knee, initial encounter: Secondary | ICD-10-CM | POA: Diagnosis present

## 2015-02-07 DIAGNOSIS — S83512A Sprain of anterior cruciate ligament of left knee, initial encounter: Secondary | ICD-10-CM | POA: Insufficient documentation

## 2015-02-07 DIAGNOSIS — K219 Gastro-esophageal reflux disease without esophagitis: Secondary | ICD-10-CM | POA: Diagnosis not present

## 2015-02-07 HISTORY — PX: KNEE ARTHROSCOPY WITH LATERAL MENISECTOMY: SHX6193

## 2015-02-07 HISTORY — DX: Intervertebral disc disorders with radiculopathy, lumbar region: M51.16

## 2015-02-07 HISTORY — PX: KNEE ARTHROSCOPY WITH MEDIAL MENISECTOMY: SHX5651

## 2015-02-07 LAB — POCT HEMOGLOBIN-HEMACUE: Hemoglobin: 12.5 g/dL (ref 12.0–15.0)

## 2015-02-07 SURGERY — ARTHROSCOPY, KNEE, WITH LATERAL MENISCECTOMY
Anesthesia: General | Site: Knee | Laterality: Left

## 2015-02-07 MED ORDER — PROPOFOL 10 MG/ML IV BOLUS
INTRAVENOUS | Status: DC | PRN
  Administered 2015-02-07: 200 mg via INTRAVENOUS

## 2015-02-07 MED ORDER — BUPIVACAINE-EPINEPHRINE 0.25% -1:200000 IJ SOLN
INTRAMUSCULAR | Status: DC | PRN
  Administered 2015-02-07: 20 mL

## 2015-02-07 MED ORDER — SCOPOLAMINE 1 MG/3DAYS TD PT72: 1.0000 | MEDICATED_PATCH | Freq: Once | TRANSDERMAL | Status: DC | PRN

## 2015-02-07 MED ORDER — FENTANYL CITRATE (PF) 100 MCG/2ML IJ SOLN
50.0000 ug | INTRAMUSCULAR | Status: DC | PRN
  Administered 2015-02-07: 100 ug via INTRAVENOUS
  Administered 2015-02-07: 50 ug via INTRAVENOUS

## 2015-02-07 MED ORDER — FENTANYL CITRATE (PF) 100 MCG/2ML IJ SOLN
INTRAMUSCULAR | Status: AC
  Filled 2015-02-07: qty 2

## 2015-02-07 MED ORDER — OXYCODONE HCL 5 MG PO TABS: 5.0000 mg | ORAL_TABLET | Freq: Once | ORAL | Status: DC | PRN

## 2015-02-07 MED ORDER — DEXTROSE 5 % IV SOLN
3.0000 g | INTRAVENOUS | Status: AC
  Administered 2015-02-07: 3 g via INTRAVENOUS

## 2015-02-07 MED ORDER — OXYCODONE HCL 5 MG PO TABS: ORAL_TABLET | ORAL | Status: AC

## 2015-02-07 MED ORDER — GLYCOPYRROLATE 0.2 MG/ML IJ SOLN: 0.2000 mg | Freq: Once | INTRAMUSCULAR | Status: DC | PRN

## 2015-02-07 MED ORDER — MEPERIDINE HCL 25 MG/ML IJ SOLN: 6.2500 mg | INTRAMUSCULAR | Status: DC | PRN

## 2015-02-07 MED ORDER — BUPIVACAINE HCL (PF) 0.25 % IJ SOLN
INTRAMUSCULAR | Status: AC
  Filled 2015-02-07: qty 60

## 2015-02-07 MED ORDER — LIDOCAINE HCL (CARDIAC) 20 MG/ML IV SOLN
INTRAVENOUS | Status: DC | PRN
  Administered 2015-02-07: 80 mg via INTRAVENOUS

## 2015-02-07 MED ORDER — MIDAZOLAM HCL 2 MG/2ML IJ SOLN
INTRAMUSCULAR | Status: AC
  Filled 2015-02-07: qty 2

## 2015-02-07 MED ORDER — PROPOFOL 500 MG/50ML IV EMUL
INTRAVENOUS | Status: AC
  Filled 2015-02-07: qty 50

## 2015-02-07 MED ORDER — FENTANYL CITRATE (PF) 100 MCG/2ML IJ SOLN
INTRAMUSCULAR | Status: AC
  Filled 2015-02-07: qty 6

## 2015-02-07 MED ORDER — EPINEPHRINE HCL 1 MG/ML IJ SOLN
INTRAMUSCULAR | Status: AC
  Filled 2015-02-07: qty 1

## 2015-02-07 MED ORDER — KETOROLAC TROMETHAMINE 30 MG/ML IJ SOLN: 30.0000 mg | Freq: Once | INTRAMUSCULAR | Status: DC | PRN

## 2015-02-07 MED ORDER — OXYCODONE HCL 5 MG/5ML PO SOLN
5.0000 mg | Freq: Once | ORAL | Status: DC | PRN
Start: 2015-02-07 — End: 2015-02-07

## 2015-02-07 MED ORDER — PROMETHAZINE HCL 25 MG/ML IJ SOLN: 6.2500 mg | INTRAMUSCULAR | Status: DC | PRN

## 2015-02-07 MED ORDER — ONDANSETRON HCL 4 MG/2ML IJ SOLN
INTRAMUSCULAR | Status: DC | PRN
  Administered 2015-02-07: 4 mg via INTRAVENOUS

## 2015-02-07 MED ORDER — LACTATED RINGERS IV SOLN
INTRAVENOUS | Status: DC
  Administered 2015-02-07 (×2): via INTRAVENOUS

## 2015-02-07 MED ORDER — HYDROMORPHONE HCL 1 MG/ML IJ SOLN
0.2500 mg | INTRAMUSCULAR | Status: DC | PRN
Start: 2015-02-07 — End: 2015-02-07

## 2015-02-07 MED ORDER — CEFAZOLIN SODIUM-DEXTROSE 2-3 GM-% IV SOLR
INTRAVENOUS | Status: AC
  Filled 2015-02-07: qty 50

## 2015-02-07 MED ORDER — PROPOFOL 10 MG/ML IV BOLUS
INTRAVENOUS | Status: AC
  Filled 2015-02-07: qty 80

## 2015-02-07 MED ORDER — DEXAMETHASONE SODIUM PHOSPHATE 4 MG/ML IJ SOLN
INTRAMUSCULAR | Status: DC | PRN
  Administered 2015-02-07: 10 mg via INTRAVENOUS

## 2015-02-07 MED ORDER — CHLORHEXIDINE GLUCONATE 4 % EX LIQD: 60.0000 mL | Freq: Once | CUTANEOUS | Status: DC

## 2015-02-07 MED ORDER — CEFAZOLIN SODIUM 1-5 GM-% IV SOLN
INTRAVENOUS | Status: AC
  Filled 2015-02-07: qty 50

## 2015-02-07 MED ORDER — MIDAZOLAM HCL 2 MG/2ML IJ SOLN
1.0000 mg | INTRAMUSCULAR | Status: DC | PRN
  Administered 2015-02-07: 2 mg via INTRAVENOUS

## 2015-02-07 SURGICAL SUPPLY — 41 items
BANDAGE ELASTIC 6 VELCRO ST LF (GAUZE/BANDAGES/DRESSINGS) ×3 IMPLANT
BLADE CUTTER GATOR 3.5 (BLADE) IMPLANT
BLADE GREAT WHITE 4.2 (BLADE) IMPLANT
BLADE GREAT WHITE 4.2MM (BLADE)
BLADE SURG 15 STRL LF DISP TIS (BLADE) IMPLANT
BLADE SURG 15 STRL SS (BLADE)
BNDG COHESIVE 4X5 TAN STRL (GAUZE/BANDAGES/DRESSINGS) IMPLANT
DRAPE ARTHROSCOPY W/POUCH 90 (DRAPES) ×3 IMPLANT
DURAPREP 26ML APPLICATOR (WOUND CARE) ×3 IMPLANT
GAUZE SPONGE 4X4 12PLY STRL (GAUZE/BANDAGES/DRESSINGS) ×3 IMPLANT
GAUZE XEROFORM 1X8 LF (GAUZE/BANDAGES/DRESSINGS) ×3 IMPLANT
GLOVE BIO SURGEON STRL SZ7 (GLOVE) ×3 IMPLANT
GLOVE BIOGEL PI IND STRL 7.0 (GLOVE) ×1 IMPLANT
GLOVE BIOGEL PI IND STRL 7.5 (GLOVE) ×1 IMPLANT
GLOVE BIOGEL PI INDICATOR 7.0 (GLOVE) ×2
GLOVE BIOGEL PI INDICATOR 7.5 (GLOVE) ×2
GLOVE SS BIOGEL STRL SZ 7.5 (GLOVE) ×1 IMPLANT
GLOVE SUPERSENSE BIOGEL SZ 7.5 (GLOVE) ×2
GOWN STRL REUS W/ TWL LRG LVL3 (GOWN DISPOSABLE) ×3 IMPLANT
GOWN STRL REUS W/TWL LRG LVL3 (GOWN DISPOSABLE) ×9
HOLDER KNEE FOAM BLUE (MISCELLANEOUS) ×3 IMPLANT
KNEE WRAP E Z 3 GEL PACK (MISCELLANEOUS) ×3 IMPLANT
MANIFOLD NEPTUNE II (INSTRUMENTS) IMPLANT
NDL SAFETY ECLIPSE 18X1.5 (NEEDLE) ×2 IMPLANT
NEEDLE HYPO 18GX1.5 SHARP (NEEDLE) ×6
NEEDLE HYPO 22GX1.5 SAFETY (NEEDLE) IMPLANT
PACK ARTHROSCOPY DSU (CUSTOM PROCEDURE TRAY) ×3 IMPLANT
PACK BASIN DAY SURGERY FS (CUSTOM PROCEDURE TRAY) ×3 IMPLANT
PAD ALCOHOL SWAB (MISCELLANEOUS) IMPLANT
SET ARTHROSCOPY TUBING (MISCELLANEOUS) ×3
SET ARTHROSCOPY TUBING LN (MISCELLANEOUS) ×1 IMPLANT
SUCTION FRAZIER TIP 10 FR DISP (SUCTIONS) IMPLANT
SUT ETHILON 4 0 PS 2 18 (SUTURE) ×3 IMPLANT
SUT PROLENE 3 0 PS 2 (SUTURE) IMPLANT
SUT VIC AB 3-0 PS1 18 (SUTURE)
SUT VIC AB 3-0 PS1 18XBRD (SUTURE) IMPLANT
SYR 20CC LL (SYRINGE) IMPLANT
SYR 5ML LL (SYRINGE) ×3 IMPLANT
TOWEL OR 17X24 6PK STRL BLUE (TOWEL DISPOSABLE) ×3 IMPLANT
WAND STAR VAC 90 (SURGICAL WAND) IMPLANT
WATER STERILE IRR 1000ML POUR (IV SOLUTION) ×3 IMPLANT

## 2015-02-07 NOTE — Progress Notes (Signed)
AssistedDr. Germeroth with left, ultrasound guided, adductor canal block. Side rails up, monitors on throughout procedure. See vital signs in flow sheet. Tolerated Procedure well.  

## 2015-02-07 NOTE — Transfer of Care (Signed)
Immediate Anesthesia Transfer of Care Note  Patient: Cottie BandaDanielle L Ueda  Procedure(s) Performed: Procedure(s): KNEE ARTHROSCOPY WITH PARTIAL LATERAL MENISECTOMY (Left) KNEE ARTHROSCOPY WITH PARTIAL MEDIAL MENISECTOMY (Left)  Patient Location: PACU  Anesthesia Type:General  Level of Consciousness: awake, alert  and oriented  Airway & Oxygen Therapy: Patient Spontanous Breathing and Patient connected to face mask oxygen  Post-op Assessment: Report given to RN and Post -op Vital signs reviewed and stable  Post vital signs: Reviewed and stable  Last Vitals:  Filed Vitals:   02/07/15 0935  BP:   Pulse: 92  Temp:   Resp: 21    Complications: No apparent anesthesia complications

## 2015-02-07 NOTE — Discharge Instructions (Signed)
Call your surgeon if you experience:   1.  Fever over 101.0. 2.  Inability to urinate. 3.  Nausea and/or vomiting. 4.  Extreme swelling or bruising at the surgical site. 5.  Continued bleeding from the incision. 6.  Increased pain, redness or drainage from the incision. 7.  Problems related to your pain medication. 8. Any change in color, movement and/or sensation 9. Any problems and/or concerns  Regional Anesthesia Blocks  1. Numbness or the inability to move the "blocked" extremity may last from 3-48 hours after placement. The length of time depends on the medication injected and your individual response to the medication. If the numbness is not going away after 48 hours, call your surgeon.  2. The extremity that is blocked will need to be protected until the numbness is gone and the  Strength has returned. Because you cannot feel it, you will need to take extra care to avoid injury. Because it may be weak, you may have difficulty moving it or using it. You may not know what position it is in without looking at it while the block is in effect.  3. For blocks in the legs and feet, returning to weight bearing and walking needs to be done carefully. You will need to wait until the numbness is entirely gone and the strength has returned. You should be able to move your leg and foot normally before you try and bear weight or walk. You will need someone to be with you when you first try to ensure you do not fall and possibly risk injury.  4. Bruising and tenderness at the needle site are common side effects and will resolve in a few days.  5. Persistent numbness or new problems with movement should be communicated to the surgeon or the [facility] [phone])/ [facility] [phone]).   Post Anesthesia Home Care Instructions  Activity: Get plenty of rest for the remainder of the day. A responsible adult should stay with you for 24 hours following the procedure.    For the next 24 hours, DO NOT: -Drive a car -Advertising copywriterperate machinery -Drink alcoholic beverages -Take any medication unless instructed by your physician -Make any legal decisions or sign important papers.  Meals: Start with liquid foods such as gelatin or soup. Progress to regular foods as tolerated. Avoid greasy, spicy, heavy foods. If nausea and/or vomiting occur, drink only clear liquids until the nausea and/or vomiting subsides. Call your physician if vomiting continues.  Special Instructions/Symptoms: Your throat may feel dry or sore from the anesthesia or the breathing tube placed in your throat during surgery. If this causes discomfort, gargle with warm salt water. The discomfort should disappear within 24 hours.  If you had a scopolamine patch placed behind your ear for the management of post- operative nausea and/or vomiting:  1. The medication in the patch is effective for 72 hours, after which it should be removed.  Wrap patch in a tissue and discard in the trash. Wash hands thoroughly with soap and water. 2. You may remove the patch earlier than 72 hours if you experience unpleasant side effects which may include dry mouth, dizziness or visual disturbances. 3. Avoid touching the patch. Wash your hands with soap and water after contact with the patch.

## 2015-02-07 NOTE — Interval H&P Note (Signed)
History and Physical Interval Note:  02/07/2015 8:45 AM  Lauren Keith  has presented today for surgery, with the diagnosis of LEFT KNEE MEDIAL AND LATERAL MENISCUS TEARS  The various methods of treatment have been discussed with the patient and family. After consideration of risks, benefits and other options for treatment, the patient has consented to  Procedure(s): LEFT KNEE ARTHROSCOPY WITH MEDIAL AND LATERAL  MENISECTOMY (Left) as a surgical intervention .  The patient's history has been reviewed, patient examined, no change in status, stable for surgery.  I have reviewed the patient's chart and labs.  Questions were answered to the patient's satisfaction.     Salvatore MarvelWAINER,Kaiser Belluomini A

## 2015-02-07 NOTE — Anesthesia Postprocedure Evaluation (Signed)
Anesthesia Post Note  Patient: Lauren Keith  Procedure(s) Performed: Procedure(s) (LRB): KNEE ARTHROSCOPY WITH PARTIAL LATERAL MENISECTOMY (Left) KNEE ARTHROSCOPY WITH PARTIAL MEDIAL MENISECTOMY (Left)  Anesthesia type: General  Patient location: PACU  Post pain: Pain level controlled  Post assessment: Post-op Vital signs reviewed  Last Vitals: BP 140/79 mmHg  Pulse 75  Temp(Src) 36.4 C  Resp 18  Ht 5\' 8"  (1.727 m)  Wt 263 lb 6 oz (119.466 kg)  BMI 40.06 kg/m2  SpO2 99%  Post vital signs: Reviewed  Level of consciousness: sedated  Complications: No apparent anesthesia complications

## 2015-02-07 NOTE — Anesthesia Procedure Notes (Signed)
Procedure Name: LMA Insertion Date/Time: 02/07/2015 8:59 AM Performed by: Burna CashONRAD, Aul Mangieri C Pre-anesthesia Checklist: Patient identified, Emergency Drugs available, Suction available and Patient being monitored Patient Re-evaluated:Patient Re-evaluated prior to inductionOxygen Delivery Method: Circle System Utilized Preoxygenation: Pre-oxygenation with 100% oxygen Intubation Type: IV induction Ventilation: Mask ventilation without difficulty LMA: LMA inserted LMA Size: 4.0 Number of attempts: 1 Airway Equipment and Method: Bite block Placement Confirmation: positive ETCO2 Tube secured with: Tape Dental Injury: Teeth and Oropharynx as per pre-operative assessment

## 2015-02-07 NOTE — Addendum Note (Signed)
Addendum  created 02/07/15 1440 by Burna CashJoseph C Geraldy Akridge, CRNA   Modules edited: Charges VN

## 2015-02-07 NOTE — Anesthesia Preprocedure Evaluation (Signed)
Anesthesia Evaluation  Patient identified by MRN, date of birth, ID band Patient awake    Reviewed: Allergy & Precautions, H&P , NPO status , Patient's Chart, lab work & pertinent test results  Airway Mallampati: III  TM Distance: >3 FB Neck ROM: full    Dental no notable dental hx. (+) Teeth Intact, Dental Advisory Given   Pulmonary sleep apnea and Continuous Positive Airway Pressure Ventilation ,  breath sounds clear to auscultation  Pulmonary exam normal       Cardiovascular Exercise Tolerance: Good hypertension, Pt. on medications negative cardio ROS Normal cardiovascular examRhythm:regular Rate:Normal     Neuro/Psych negative neurological ROS  negative psych ROS   GI/Hepatic negative GI ROS, Neg liver ROS,   Endo/Other  negative endocrine ROSMorbid obesity  Renal/GU negative Renal ROS     Musculoskeletal  (+) Arthritis -,   Abdominal   Peds  Hematology negative hematology ROS (+)   Anesthesia Other Findings   Reproductive/Obstetrics negative OB ROS                             Anesthesia Physical  Anesthesia Plan  ASA: III  Anesthesia Plan: General   Post-op Pain Management:    Induction: Intravenous  Airway Management Planned: LMA  Additional Equipment: None  Intra-op Plan:   Post-operative Plan: Extubation in OR  Informed Consent: I have reviewed the patients History and Physical, chart, labs and discussed the procedure including the risks, benefits and alternatives for the proposed anesthesia with the patient or authorized representative who has indicated his/her understanding and acceptance.   Dental Advisory Given  Plan Discussed with: CRNA and Surgeon  Anesthesia Plan Comments:         Anesthesia Quick Evaluation

## 2015-02-10 NOTE — Op Note (Signed)
NAMJethro Bolus:  Lauren Keith, Lauren Keith             ACCOUNT NO.:  [id number]  MEDICAL RECORD NO.:  [id number]  LOCATION:                                 FACILITY:  PHYSICIAN:  Elana Almobert A. Thurston HoleWainer, M.D.      DATE OF BIRTH:  DATE OF PROCEDURE:  02/07/2015 DATE OF DISCHARGE:                              OPERATIVE REPORT   PREOPERATIVE DIAGNOSIS: 1. 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 2. 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  POSTOPERATIVE DIAGNOSIS: 1. 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 2. 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  PROCEDURE: 1. Left knee EUA, followed by arthroscopic partial medial and lateral     meniscectomies. 2. Left knee chondroplasty, tricompartmental.  SURGEON:  Jacquan Savas A. Thurston HoleWainer, M.D.  ANESTHESIA:  General.  OPERATIVE TIME:  30 minutes.  DESCRIPTION OF PROCEDURE:  Lauren Keith was brought to the operating room on February 07, 2015, after a knee adductor canal block was placed in the holding room by Anesthesia.  She was placed on operative table in supine position.  She received antibiotics preoperatively for prophylaxis. After being placed under general anesthesia, her left knee was sterilely injected with 0.25% Marcaine with epinephrine.  Left leg was prepped using sterile DuraPrep and draped using sterile technique.  Time-out procedure was called, and the correct left knee identified.  Initially, through an anterolateral portal, the arthroscope with a pump attached was placed into an anteromedial portal and arthroscopic probe was placed.  On initial inspection of the medial compartment, she had 30-40% grade 3 chondromalacia, which was debrided.  Medial meniscus showed a tear of the posterior and medial horn of which 30-40% was resected back to a stable rim.  Intercondylar notch inspected.  Anterior  cruciate ligament had mild partial tearing in it, but it was otherwise intact and stable.  PCL was intact and stable.  Lateral compartment was inspected. The articular cartilage showed 30% grade 3 chondromalacia, which was debrided.  Lateral meniscus showed tearing of the posterior and lateral horn of which 30-40% was resected back to a stable rim.  Patellofemoral joint showed grade 1 and 2 chondromalacia.  The patella tracked normally.  Medial and lateral gutters were free of pathology.  After this done, it is felt that all pathology had been satisfactorily addressed.  The instruments were removed.  Portals were closed with 3-0 nylon suture.  Sterile dressings were applied, and the patient awakened, taken to the recovery in stable condition.  FOLLOWUP CARE:  The patient will be treated as an outpatient, on OxyIR for pain.  Seen back in office in a week for sutures out and followup.     Rhondalyn Clingan A. Thurston HoleWainer, M.D.     RAW/MEDQ  D:  02/07/2015  T:  02/08/2015  Job:  [id number]

## 2015-02-12 ENCOUNTER — Encounter (HOSPITAL_BASED_OUTPATIENT_CLINIC_OR_DEPARTMENT_OTHER): Payer: Self-pay | Admitting: Orthopedic Surgery

## 2015-06-23 ENCOUNTER — Emergency Department (HOSPITAL_COMMUNITY)
Admission: EM | Admit: 2015-06-23 | Discharge: 2015-06-23 | Disposition: A | Discharge status: Home / Self Care | Payer: 59 | Source: Home / Self Care | Attending: Family Medicine | Admitting: Family Medicine

## 2015-06-23 ENCOUNTER — Other Ambulatory Visit (HOSPITAL_COMMUNITY)
Admission: RE | Admit: 2015-06-23 | Discharge: 2015-06-23 | Disposition: A | Discharge status: Home / Self Care | Payer: 59 | Source: Ambulatory Visit | Attending: Family Medicine | Admitting: Family Medicine

## 2015-06-23 ENCOUNTER — Encounter (HOSPITAL_COMMUNITY): Payer: Self-pay | Admitting: *Deleted

## 2015-06-23 DIAGNOSIS — N898 Other specified noninflammatory disorders of vagina: Secondary | ICD-10-CM

## 2015-06-23 DIAGNOSIS — N760 Acute vaginitis: Secondary | ICD-10-CM

## 2015-06-23 DIAGNOSIS — B9689 Other specified bacterial agents as the cause of diseases classified elsewhere: Secondary | ICD-10-CM

## 2015-06-23 DIAGNOSIS — M549 Dorsalgia, unspecified: Secondary | ICD-10-CM | POA: Diagnosis not present

## 2015-06-23 DIAGNOSIS — Z113 Encounter for screening for infections with a predominantly sexual mode of transmission: Secondary | ICD-10-CM | POA: Diagnosis not present

## 2015-06-23 DIAGNOSIS — M545 Low back pain: Secondary | ICD-10-CM

## 2015-06-23 DIAGNOSIS — A499 Bacterial infection, unspecified: Secondary | ICD-10-CM

## 2015-06-23 DIAGNOSIS — G8929 Other chronic pain: Secondary | ICD-10-CM

## 2015-06-23 DIAGNOSIS — L298 Other pruritus: Secondary | ICD-10-CM | POA: Diagnosis not present

## 2015-06-23 DIAGNOSIS — M7918 Myalgia, other site: Secondary | ICD-10-CM

## 2015-06-23 DIAGNOSIS — N390 Urinary tract infection, site not specified: Secondary | ICD-10-CM

## 2015-06-23 LAB — POCT URINALYSIS DIP (DEVICE)
BILIRUBIN URINE: NEGATIVE
Glucose, UA: NEGATIVE mg/dL
KETONES UR: NEGATIVE mg/dL
Nitrite: POSITIVE — AB
Protein, ur: NEGATIVE mg/dL
Specific Gravity, Urine: 1.02 (ref 1.005–1.030)
Urobilinogen, UA: 0.2 mg/dL (ref 0.0–1.0)
pH: 7 (ref 5.0–8.0)

## 2015-06-23 MED ORDER — CEPHALEXIN 250 MG PO CAPS: 250.0000 mg | ORAL_CAPSULE | Freq: Four times a day (QID) | ORAL | Status: DC

## 2015-06-23 MED ORDER — METRONIDAZOLE 500 MG PO TABS: 500.0000 mg | ORAL_TABLET | Freq: Two times a day (BID) | ORAL | Status: DC

## 2015-06-23 MED ORDER — FLUCONAZOLE 150 MG PO TABS: ORAL_TABLET | ORAL | Status: DC

## 2015-06-23 MED ORDER — DICLOFENAC POTASSIUM 50 MG PO TABS: 50.0000 mg | ORAL_TABLET | Freq: Three times a day (TID) | ORAL | Status: DC

## 2015-06-23 NOTE — ED Provider Notes (Signed)
CSN: [id number]     Arrival date & time 06/23/15  1300 History   First MD Initiated Contact with Patient 06/23/15 1315     Chief Complaint  Patient presents with  . Vaginal Discharge   (Consider location/radiation/quality/duration/timing/severity/associated sxs/prior Treatment) HPI Comments: 49 year old female complaining of vaginal burning, itching and vaginal discharge for 48 hours. She is having some dysuria associated with urinary frequency and urgency. All of the symptoms began approximately 2 days ago. In addition she has chronic lower back pain. She states she has a disc problem. Her pain is located to the lower thoracic, upper lumbar  paraspinal musculature. it is worse with bending twisting and other positions. She states it is brought on by prolonged sitting, bad posture and sometimes the shoes that she wears at work. There is been no  acute injury.    Past Medical History  Diagnosis Date  . Plantar fasciitis, bilateral   . Acute meniscal tear of knee left knee  . Arthritis KNEES AND FEET  . History of gastroesophageal reflux (GERD)   . Lumbar disc herniation with radiculopathy    Past Surgical History  Procedure Laterality Date  . Laparoscopic gastric banding  04/2007   [place]    [facility];   AND PORT REMOVAL/ REPLACEMENT 2009  . Appendectomy  2010  . Cholecystectomy  2009  . Tubal ligation  2002  . Total abdominal hysterectomy w/ bilateral salpingoophorectomy  2007    W/ ANTERIOR AND POSTERIOR REPAIR  . Knee arthroscopy  02/25/2012    Procedure: ARTHROSCOPY KNEE;  Surgeon: Javier DockerJeffrey C Beane, MD;  [facility];  Service: Orthopedics;  Laterality: Right;  WITH DEBRIDEMENT AND PARTIAL LATERAL MENISECTOMY AND DRAINAGE OF CYST  . Laparoscopic repair and removal of gastric band  12/2012    [facility]  . Knee arthroscopy with lateral menisectomy Left 02/07/2015    Procedure: KNEE ARTHROSCOPY WITH PARTIAL LATERAL MENISECTOMY;  Surgeon: Salvatore Marvelobert Wainer,  MD;  Location: [facility];  Service: Orthopedics;  Laterality: Left;  . Knee arthroscopy with medial menisectomy Left 02/07/2015    Procedure: KNEE ARTHROSCOPY WITH PARTIAL MEDIAL MENISECTOMY;  Surgeon: Salvatore Marvelobert Wainer, MD;  Location: [facility];  Service: Orthopedics;  Laterality: Left;   Family History  Problem Relation Age of Onset  . Hypertension Mother   . Diabetes Mother   . Cancer Mother     breast  . Heart disease Father   . Hypertension Father   . Hypertension Brother   . Cancer Paternal Grandmother     ovarian   Social History  Substance Use Topics  . Smoking status: Never Smoker   . Smokeless tobacco: Never Used  . Alcohol Use: No   OB History    No data available     Review of Systems  Constitutional: Negative for fever, diaphoresis, activity change and fatigue.  Respiratory: Negative.  Negative for cough and shortness of breath.   Cardiovascular: Negative for chest pain.  Gastrointestinal: Negative.   Genitourinary: Positive for dysuria, urgency, frequency and vaginal discharge. Negative for hematuria, genital sores and pelvic pain.  Musculoskeletal: Positive for myalgias and back pain.  Skin: Negative.   Neurological: Negative.   Psychiatric/Behavioral: Negative.     Allergies  Review of patient's allergies indicates no known allergies.  Home Medications   Prior to Admission medications   Medication Sig Start Date End Date Taking? Authorizing Provider  ALPRAZolam Prudy Feeler(XANAX) 0.5 MG tablet Take 0.5 mg by mouth 2 (two)  times daily.    Historical Provider, MD  cephALEXin (KEFLEX) 250 MG capsule Take 1 capsule (250 mg total) by mouth 4 (four) times daily. 06/23/15   Hayden Rasmussen, NP  diclofenac (CATAFLAM) 50 MG tablet Take 1 tablet (50 mg total) by mouth 3 (three) times daily. One tablet TID with food prn pain. 06/23/15   Hayden Rasmussen, NP  DULoxetine (CYMBALTA) 60 MG capsule Take 60 mg by mouth daily.    Historical Provider, MD  fluconazole  (DIFLUCAN) 150 MG tablet 1 tab po x 1. May repeat in 72 hours if no improvement 06/23/15   Hayden Rasmussen, NP  ibuprofen (ADVIL,MOTRIN) 800 MG tablet Take 800 mg by mouth every 8 (eight) hours as needed.    Historical Provider, MD  lisinopril-hydrochlorothiazide (PRINZIDE,ZESTORETIC) 20-25 MG per tablet Take 1 tablet by mouth daily.    Historical Provider, MD  metroNIDAZOLE (FLAGYL) 500 MG tablet Take 1 tablet (500 mg total) by mouth 2 (two) times daily. X 7 days 06/23/15   Hayden Rasmussen, NP  Multiple Vitamin (MULTIVITAMIN) capsule Take 1 capsule by mouth daily.    Historical Provider, MD  oxyCODONE (ROXICODONE) 5 MG immediate release tablet 1-2 tablets every 4-6 hrs as needed for pain 02/07/15   Kirstin Shepperson, PA-C   Meds Ordered and Administered this Visit  Medications - No data to display  BP 169/83 mmHg  Pulse 77  Temp(Src) 98.3 F (36.8 C) (Oral)  Resp 16  SpO2 100% No data found.   Physical Exam  Constitutional: She is oriented to person, place, and time. She appears well-developed and well-nourished. No distress.  Eyes: EOM are normal.  Neck: Normal range of motion. Neck supple.  Cardiovascular: Normal rate.   Pulmonary/Chest: Effort normal. No respiratory distress.  Genitourinary:  Normal external female genitalia. Redundant vaginal walls. Unable to visualize or capture the cervix due to body habitus. Vaginal walls coated with a thick white malodorous vaginal discharge. No pelvic tenderness on bimanual. No external lesions.  Musculoskeletal: She exhibits no edema.  There is tenderness across the lower thoracic/upper lumbar para musculature. There is palpable muscle spasms within this area. No spinal deformity, discoloration or swelling.  Neurological: She is alert and oriented to person, place, and time. She exhibits normal muscle tone.  Skin: Skin is warm and dry.  Psychiatric: She has a normal mood and affect.  Nursing note and vitals reviewed.   ED Course  Procedures  (including critical care time)  Labs Review Labs Reviewed  POCT URINALYSIS DIP (DEVICE) - Abnormal; Notable for the following:    Hgb urine dipstick SMALL (*)    Nitrite POSITIVE (*)    Leukocytes, UA TRACE (*)    All other components within normal limits  URINE CULTURE  CERVICOVAGINAL ANCILLARY ONLY   Results for orders placed or performed during the hospital encounter of 06/23/15  POCT urinalysis dip (device)  Result Value Ref Range   Glucose, UA NEGATIVE NEGATIVE mg/dL   Bilirubin Urine NEGATIVE NEGATIVE   Ketones, ur NEGATIVE NEGATIVE mg/dL   Specific Gravity, Urine 1.020 1.005 - 1.030   Hgb urine dipstick SMALL (A) NEGATIVE   pH 7.0 5.0 - 8.0   Protein, ur NEGATIVE NEGATIVE mg/dL   Urobilinogen, UA 0.2 0.0 - 1.0 mg/dL   Nitrite POSITIVE (A) NEGATIVE   Leukocytes, UA TRACE (A) NEGATIVE    Imaging Review No results found.   Visual Acuity Review  Right Eye Distance:   Left Eye Distance:   Bilateral Distance:    Right  Eye Near:   Left Eye Near:    Bilateral Near:         MDM   1. Vaginal discharge   2. BV (bacterial vaginosis)   3. Vaginal itching   4. Chronic back pain   5. Lumbar muscle pain   6. UTI (lower urinary tract infection)    Heat and stretches to the back. Cataflam 50 mg 3 times a day with food as directed use good back mechanics and posture. Flagyl 500 mg twice a day Diflucan 150 mg 2 Keflex as directed drink clear fluids stay well-hydrated Vaginal cytology pending. Urine culture pending.    Hayden Rasmussen, NP 06/23/15 [id number]

## 2015-06-23 NOTE — ED Notes (Signed)
Pt  Reports  Symptoms      Of  Low  Back pain    Itching  As   Well    Vaginal  Discharge         As  Well   Frequency  And  Burning  On  Urination       Symptoms    Began   About  4  Days  Ago

## 2015-06-23 NOTE — Discharge Instructions (Signed)
Bacterial Vaginosis Bacterial vaginosis is a vaginal infection that occurs when the normal balance of bacteria in the vagina is disrupted. It results from an overgrowth of certain bacteria. This is the most common vaginal infection in women of childbearing age. Treatment is important to prevent complications, especially in pregnant women, as it can cause a premature delivery. CAUSES  Bacterial vaginosis is caused by an increase in harmful bacteria that are normally present in smaller amounts in the vagina. Several different kinds of bacteria can cause bacterial vaginosis. However, the reason that the condition develops is not fully understood. RISK FACTORS Certain activities or behaviors can put you at an increased risk of developing bacterial vaginosis, including:  Having a new sex partner or multiple sex partners.  Douching.  Using an intrauterine device (IUD) for contraception. Women do not get bacterial vaginosis from toilet seats, bedding, swimming pools, or contact with objects around them. SIGNS AND SYMPTOMS  Some women with bacterial vaginosis have no signs or symptoms. Common symptoms include:  Grey vaginal discharge.  A fishlike odor with discharge, especially after sexual intercourse.  Itching or burning of the vagina and vulva.  Burning or pain with urination. DIAGNOSIS  Your health care provider will take a medical history and examine the vagina for signs of bacterial vaginosis. A sample of vaginal fluid may be taken. Your health care provider will look at this sample under a microscope to check for bacteria and abnormal cells. A vaginal pH test may also be done.  TREATMENT  Bacterial vaginosis may be treated with antibiotic medicines. These may be given in the form of a pill or a vaginal cream. A second round of antibiotics may be prescribed if the condition comes back after treatment. Because bacterial vaginosis increases your risk for sexually transmitted diseases, getting  treated can help reduce your risk for chlamydia, gonorrhea, HIV, and herpes. HOME CARE INSTRUCTIONS   Only take over-the-counter or prescription medicines as directed by your health care provider.  If antibiotic medicine was prescribed, take it as directed. Make sure you finish it even if you start to feel better.  Tell all sexual partners that you have a vaginal infection. They should see their health care provider and be treated if they have problems, such as a mild rash or itching.  During treatment, it is important that you follow these instructions:  Avoid sexual activity or use condoms correctly.  Do not douche.  Avoid alcohol as directed by your health care provider.  Avoid breastfeeding as directed by your health care provider. SEEK MEDICAL CARE IF:   Your symptoms are not improving after 3 days of treatment.  You have increased discharge or pain.  You have a fever. MAKE SURE YOU:   Understand these instructions.  Will watch your condition.  Will get help right away if you are not doing well or get worse. FOR MORE INFORMATION  Centers for Disease Control and Prevention, Division of STD Prevention: [employer] American Sexual Health Association (ASHA): www.ashastd.org    This information is not intended to replace advice given to you by your health care provider. Make sure you discuss any questions you have with your health care provider.   Document Released: 07/26/2005 Document Revised: 08/16/2014 Document Reviewed: 03/07/2013 Elsevier Interactive Patient Education 2016 Elsevier Inc.  Back Pain, Adult Back pain is very common in adults.The cause of back pain is rarely dangerous and the pain often gets better over time.The cause of your back pain may not be known. Some common  causes of back pain include:  Strain of the muscles or ligaments supporting the spine.  Wear and tear (degeneration) of the spinal disks.  Arthritis.  Direct injury to the  back. For many people, back pain may return. Since back pain is rarely dangerous, most people can learn to manage this condition on their own. HOME CARE INSTRUCTIONS Watch your back pain for any changes. The following actions may help to lessen any discomfort you are feeling:  Remain active. It is stressful on your back to sit or stand in one place for long periods of time. Do not sit, drive, or stand in one place for more than 30 minutes at a time. Take short walks on even surfaces as soon as you are able.Try to increase the length of time you walk each day.  Exercise regularly as directed by your health care provider. Exercise helps your back heal faster. It also helps avoid future injury by keeping your muscles strong and flexible.  Do not stay in bed.Resting more than 1-2 days can delay your recovery.  Pay attention to your body when you bend and lift. The most comfortable positions are those that put less stress on your recovering back. Always use proper lifting techniques, including:  Bending your knees.  Keeping the load close to your body.  Avoiding twisting.  Find a comfortable position to sleep. Use a firm mattress and lie on your side with your knees slightly bent. If you lie on your back, put a pillow under your knees.  Avoid feeling anxious or stressed.Stress increases muscle tension and can worsen back pain.It is important to recognize when you are anxious or stressed and learn ways to manage it, such as with exercise.  Take medicines only as directed by your health care provider. Over-the-counter medicines to reduce pain and inflammation are often the most helpful.Your health care provider may prescribe muscle relaxant drugs.These medicines help dull your pain so you can more quickly return to your normal activities and healthy exercise.  Apply ice to the injured area:  Put ice in a plastic bag.  Place a towel between your skin and the bag.  Leave the ice on for 20  minutes, 2-3 times a day for the first 2-3 days. After that, ice and heat may be alternated to reduce pain and spasms.  Maintain a healthy weight. Excess weight puts extra stress on your back and makes it difficult to maintain good posture. SEEK MEDICAL CARE IF:  You have pain that is not relieved with rest or medicine.  You have increasing pain going down into the legs or buttocks.  You have pain that does not improve in one week.  You have night pain.  You lose weight.  You have a fever or chills. SEEK IMMEDIATE MEDICAL CARE IF:   You develop new bowel or bladder control problems.  You have unusual weakness or numbness in your arms or legs.  You develop nausea or vomiting.  You develop abdominal pain.  You feel faint.   This information is not intended to replace advice given to you by your health care provider. Make sure you discuss any questions you have with your health care provider.   Document Released: 07/26/2005 Document Revised: 08/16/2014 Document Reviewed: 11/27/2013 Elsevier Interactive Patient Education 2016 Elsevier Inc.  Chronic Back Pain  When back pain lasts longer than 3 months, it is called chronic back pain.People with chronic back pain often go through certain periods that are more intense (flare-ups).  CAUSES Chronic back pain can be caused by wear and tear (degeneration) on different structures in your back. These structures include:  The bones of your spine (vertebrae) and the joints surrounding your spinal cord and nerve roots (facets).  The strong, fibrous tissues that connect your vertebrae (ligaments). Degeneration of these structures may result in pressure on your nerves. This can lead to constant pain. HOME CARE INSTRUCTIONS  Avoid bending, heavy lifting, prolonged sitting, and activities which make the problem worse.  Take brief periods of rest throughout the day to reduce your pain. Lying down or standing usually is better than sitting  while you are resting.  Take over-the-counter or prescription medicines only as directed by your caregiver. SEEK IMMEDIATE MEDICAL CARE IF:   You have weakness or numbness in one of your legs or feet.  You have trouble controlling your bladder or bowels.  You have nausea, vomiting, abdominal pain, shortness of breath, or fainting.   This information is not intended to replace advice given to you by your health care provider. Make sure you discuss any questions you have with your health care provider.   Document Released: 09/02/2004 Document Revised: 10/18/2011 Document Reviewed: 01/13/2015 Elsevier Interactive Patient Education 2016 Elsevier Inc.  [employer] Therapy Heat therapy can help ease sore, stiff, injured, and tight muscles and joints. Heat relaxes your muscles, which may help ease your pain.  RISKS AND COMPLICATIONS If you have any of the following conditions, do not use heat therapy unless your health care provider has approved:  Poor circulation.  Healing wounds or scarred skin in the area being treated.  Diabetes, heart disease, or high blood pressure.  Not being able to feel (numbness) the area being treated.  Unusual swelling of the area being treated.  Active infections.  Blood clots.  Cancer.  Inability to communicate pain. This may include young children and people who have problems with their brain function (dementia).  Pregnancy. Heat therapy should only be used on old, pre-existing, or long-lasting (chronic) injuries. Do not use heat therapy on new injuries unless directed by your health care provider. HOW TO USE HEAT THERAPY There are several different kinds of heat therapy, including:  Moist heat pack.  Warm water bath.  Hot water bottle.  Electric heating pad.  Heated gel pack.  Heated wrap.  Electric heating pad. Use the heat therapy method suggested by your health care provider. Follow your health care provider's instructions on when and  how to use heat therapy. GENERAL HEAT THERAPY RECOMMENDATIONS  Do not sleep while using heat therapy. Only use heat therapy while you are awake.  Your skin may turn pink while using heat therapy. Do not use heat therapy if your skin turns red.  Do not use heat therapy if you have new pain.  High heat or long exposure to heat can cause burns. Be careful when using heat therapy to avoid burning your skin.  Do not use heat therapy on areas of your skin that are already irritated, such as with a rash or sunburn. SEEK MEDICAL CARE IF:  You have blisters, redness, swelling, or numbness.  You have new pain.  Your pain is worse. MAKE SURE YOU:  Understand these instructions.  Will watch your condition.  Will get help right away if you are not doing well or get worse.   This information is not intended to replace advice given to you by your health care provider. Make sure you discuss any questions you have with your health  care provider.   Document Released: 10/18/2011 Document Revised: 08/16/2014 Document Reviewed: 09/18/2013 Elsevier Interactive Patient Education 2016 Elsevier Inc.  Urinary Tract Infection Urinary tract infections (UTIs) can develop anywhere along your urinary tract. Your urinary tract is your body's drainage system for removing wastes and extra water. Your urinary tract includes two kidneys, two ureters, a bladder, and a urethra. Your kidneys are a pair of bean-shaped organs. Each kidney is about the size of your fist. They are located below your ribs, one on each side of your spine. CAUSES Infections are caused by microbes, which are microscopic organisms, including fungi, viruses, and bacteria. These organisms are so small that they can only be seen through a microscope. Bacteria are the microbes that most commonly cause UTIs. SYMPTOMS  Symptoms of UTIs may vary by age and gender of the patient and by the location of the infection. Symptoms in young women typically  include a frequent and intense urge to urinate and a painful, burning feeling in the bladder or urethra during urination. Older women and men are more likely to be tired, shaky, and weak and have muscle aches and abdominal pain. A fever may mean the infection is in your kidneys. Other symptoms of a kidney infection include pain in your back or sides below the ribs, nausea, and vomiting. DIAGNOSIS To diagnose a UTI, your caregiver will ask you about your symptoms. Your caregiver will also ask you to provide a urine sample. The urine sample will be tested for bacteria and white blood cells. White blood cells are made by your body to help fight infection. TREATMENT  Typically, UTIs can be treated with medication. Because most UTIs are caused by a bacterial infection, they usually can be treated with the use of antibiotics. The choice of antibiotic and length of treatment depend on your symptoms and the type of bacteria causing your infection. HOME CARE INSTRUCTIONS  If you were prescribed antibiotics, take them exactly as your caregiver instructs you. Finish the medication even if you feel better after you have only taken some of the medication.  Drink enough water and fluids to keep your urine clear or pale yellow.  Avoid caffeine, tea, and carbonated beverages. They tend to irritate your bladder.  Empty your bladder often. Avoid holding urine for long periods of time.  Empty your bladder before and after sexual intercourse.  After a bowel movement, women should cleanse from front to back. Use each tissue only once. SEEK MEDICAL CARE IF:   You have back pain.  You develop a fever.  Your symptoms do not begin to resolve within 3 days. SEEK IMMEDIATE MEDICAL CARE IF:   You have severe back pain or lower abdominal pain.  You develop chills.  You have nausea or vomiting.  You have continued burning or discomfort with urination. MAKE SURE YOU:   Understand these instructions.  Will  watch your condition.  Will get help right away if you are not doing well or get worse.   This information is not intended to replace advice given to you by your health care provider. Make sure you discuss any questions you have with your health care provider.   Document Released: 05/05/2005 Document Revised: 04/16/2015 Document Reviewed: 09/03/2011 Elsevier Interactive Patient Education 2016 [employer].  Vaginitis Vaginitis is an inflammation of the vagina. It is most often caused by a change in the normal balance of the bacteria and yeast that live in the vagina. This change in balance causes an overgrowth of  certain bacteria or yeast, which causes the inflammation. There are different types of vaginitis, but the most common types are:  Bacterial vaginosis.  Yeast infection (candidiasis).  Trichomoniasis vaginitis. This is a sexually transmitted infection (STI).  Viral vaginitis.  Atrophic vaginitis.  Allergic vaginitis. CAUSES  The cause depends on the type of vaginitis. Vaginitis can be caused by:  Bacteria (bacterial vaginosis).  Yeast (yeast infection).  A parasite (trichomoniasis vaginitis)  A virus (viral vaginitis).  Low hormone levels (atrophic vaginitis). Low hormone levels can occur during pregnancy, breastfeeding, or after menopause.  Irritants, such as bubble baths, scented tampons, and feminine sprays (allergic vaginitis). Other factors can change the normal balance of the yeast and bacteria that live in the vagina. These include:  Antibiotic medicines.  Poor hygiene.  Diaphragms, vaginal sponges, spermicides, birth control pills, and intrauterine devices (IUD).  Sexual intercourse.  Infection.  Uncontrolled diabetes.  A weakened immune system. SYMPTOMS  Symptoms can vary depending on the cause of the vaginitis. Common symptoms include:  Abnormal vaginal discharge.  The discharge is white, gray, or yellow with bacterial vaginosis.  The  discharge is thick, white, and cheesy with a yeast infection.  The discharge is frothy and yellow or greenish with trichomoniasis.  A bad vaginal odor.  The odor is fishy with bacterial vaginosis.  Vaginal itching, pain, or swelling.  Painful intercourse.  Pain or burning when urinating. Sometimes, there are no symptoms. TREATMENT  Treatment will vary depending on the type of infection.   Bacterial vaginosis and trichomoniasis are often treated with antibiotic creams or pills.  Yeast infections are often treated with antifungal medicines, such as vaginal creams or suppositories.  Viral vaginitis has no cure, but symptoms can be treated with medicines that relieve discomfort. Your sexual partner should be treated as well.  Atrophic vaginitis may be treated with an estrogen cream, pill, suppository, or vaginal ring. If vaginal dryness occurs, lubricants and moisturizing creams may help. You may be told to avoid scented soaps, sprays, or douches.  Allergic vaginitis treatment involves quitting the use of the product that is causing the problem. Vaginal creams can be used to treat the symptoms. HOME CARE INSTRUCTIONS   Take all medicines as directed by your caregiver.  Keep your genital area clean and dry. Avoid soap and only rinse the area with water.  Avoid douching. It can remove the healthy bacteria in the vagina.  Do not use tampons or have sexual intercourse until your vaginitis has been treated. Use sanitary pads while you have vaginitis.  Wipe from front to back. This avoids the spread of bacteria from the rectum to the vagina.  Let air reach your genital area.  Wear cotton underwear to decrease moisture buildup.  Avoid wearing underwear while you sleep until your vaginitis is gone.  Avoid tight pants and underwear or nylons without a cotton panel.  Take off wet clothing (especially bathing suits) as soon as possible.  Use mild, non-scented products. Avoid using  irritants, such as:  Scented feminine sprays.  Fabric softeners.  Scented detergents.  Scented tampons.  Scented soaps or bubble baths.  Practice safe sex and use condoms. Condoms may prevent the spread of trichomoniasis and viral vaginitis. SEEK MEDICAL CARE IF:   You have abdominal pain.  You have a fever or persistent symptoms for more than 2-3 days.  You have a fever and your symptoms suddenly get worse.   This information is not intended to replace advice given to you by your  health care provider. Make sure you discuss any questions you have with your health care provider.   Document Released: 05/23/2007 Document Revised: 12/10/2014 Document Reviewed: 01/06/2012 Elsevier Interactive Patient Education [employer]2016 Elsevier Inc.

## 2015-06-24 LAB — CERVICOVAGINAL ANCILLARY ONLY
Chlamydia: NEGATIVE
NEISSERIA GONORRHEA: NEGATIVE
WET PREP (BD AFFIRM): POSITIVE — AB

## 2015-06-24 NOTE — ED Notes (Signed)
Final reports of vaginal swab testing available for review. Called patient to discuss, and was advised of positive gardnerella and yeast, negative GC and chlamydia. No report for urine c&s . Will  call ASAP when urine report final

## 2015-06-25 LAB — URINE CULTURE: Special Requests: NORMAL

## 2015-06-26 NOTE — ED Notes (Signed)
Final report of urine C&S positive for enterococcus, sensitive to Rx provided day of visit

## 2016-04-25 ENCOUNTER — Encounter (HOSPITAL_COMMUNITY): Payer: Self-pay | Admitting: *Deleted

## 2016-04-25 ENCOUNTER — Emergency Department (HOSPITAL_COMMUNITY): Payer: No Typology Code available for payment source

## 2016-04-25 ENCOUNTER — Emergency Department (HOSPITAL_COMMUNITY)
Admission: EM | Admit: 2016-04-25 | Discharge: 2016-04-25 | Disposition: A | Discharge status: Home / Self Care | Payer: No Typology Code available for payment source | Attending: Emergency Medicine | Admitting: Emergency Medicine

## 2016-04-25 DIAGNOSIS — S50812A Abrasion of left forearm, initial encounter: Secondary | ICD-10-CM | POA: Diagnosis not present

## 2016-04-25 DIAGNOSIS — Y9389 Activity, other specified: Secondary | ICD-10-CM | POA: Insufficient documentation

## 2016-04-25 DIAGNOSIS — Y92410 Unspecified street and highway as the place of occurrence of the external cause: Secondary | ICD-10-CM | POA: Diagnosis not present

## 2016-04-25 DIAGNOSIS — I10 Essential (primary) hypertension: Secondary | ICD-10-CM | POA: Insufficient documentation

## 2016-04-25 DIAGNOSIS — S161XXA Strain of muscle, fascia and tendon at neck level, initial encounter: Secondary | ICD-10-CM | POA: Diagnosis not present

## 2016-04-25 DIAGNOSIS — Z79899 Other long term (current) drug therapy: Secondary | ICD-10-CM | POA: Diagnosis not present

## 2016-04-25 DIAGNOSIS — S199XXA Unspecified injury of neck, initial encounter: Secondary | ICD-10-CM | POA: Diagnosis present

## 2016-04-25 DIAGNOSIS — S29012A Strain of muscle and tendon of back wall of thorax, initial encounter: Secondary | ICD-10-CM | POA: Diagnosis not present

## 2016-04-25 DIAGNOSIS — Y999 Unspecified external cause status: Secondary | ICD-10-CM | POA: Diagnosis not present

## 2016-04-25 MED ORDER — NAPROXEN 500 MG PO TABS: 500.0000 mg | ORAL_TABLET | Freq: Two times a day (BID) | ORAL | 0 refills | Status: DC

## 2016-04-25 MED ORDER — OXYCODONE-ACETAMINOPHEN 5-325 MG PO TABS
2.0000 | ORAL_TABLET | Freq: Once | ORAL | Status: AC
  Administered 2016-04-25: 2 via ORAL
  Filled 2016-04-25: qty 2

## 2016-04-25 MED ORDER — KETOROLAC TROMETHAMINE 60 MG/2ML IM SOLN
60.0000 mg | Freq: Once | INTRAMUSCULAR | Status: AC
  Administered 2016-04-25: 60 mg via INTRAMUSCULAR
  Filled 2016-04-25: qty 2

## 2016-04-25 MED ORDER — METHOCARBAMOL 500 MG PO TABS: 500.0000 mg | ORAL_TABLET | Freq: Two times a day (BID) | ORAL | 0 refills | Status: DC

## 2016-04-25 MED ORDER — HYDROCODONE-ACETAMINOPHEN 5-325 MG PO TABS: 1.0000 | ORAL_TABLET | Freq: Four times a day (QID) | ORAL | 0 refills | Status: DC | PRN

## 2016-04-25 MED ORDER — METHOCARBAMOL 500 MG PO TABS
500.0000 mg | ORAL_TABLET | Freq: Once | ORAL | Status: AC
  Administered 2016-04-25: 500 mg via ORAL
  Filled 2016-04-25: qty 1
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
## 2016-04-25 NOTE — ED Notes (Signed)
PT DISCHARGED. INSTRUCTIONS AND PRESCRIPTIONS GIVEN. AAOX4. PT IN NO APPARENT DISTRESS. THE OPPORTUNITY TO ASK QUESTIONS WAS PROVIDED. 

## 2016-04-25 NOTE — ED Notes (Signed)
Patient transported to X-ray 
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
## 2016-10-05 ENCOUNTER — Other Ambulatory Visit: Payer: Self-pay | Admitting: Orthopedic Surgery

## 2016-10-05 DIAGNOSIS — M25511 Pain in right shoulder: Secondary | ICD-10-CM

## 2016-10-20 ENCOUNTER — Other Ambulatory Visit: Payer: 59

## 2016-10-29 ENCOUNTER — Inpatient Hospital Stay
Admission: RE | Admit: 2016-10-29 | Discharge: 2016-10-29 | Disposition: A | Discharge status: Home / Self Care | Payer: 59 | Source: Ambulatory Visit | Attending: Orthopedic Surgery | Admitting: Orthopedic Surgery

## 2016-10-29 ENCOUNTER — Inpatient Hospital Stay: Admission: RE | Admit: 2016-10-29 | Payer: 59 | Source: Ambulatory Visit

## 2016-12-27 ENCOUNTER — Other Ambulatory Visit: Payer: 59

## 2016-12-27 ENCOUNTER — Inpatient Hospital Stay: Admission: RE | Admit: 2016-12-27 | Payer: 59 | Source: Ambulatory Visit

## 2017-01-13 ENCOUNTER — Other Ambulatory Visit: Payer: 59

## 2017-02-02 ENCOUNTER — Inpatient Hospital Stay: Admission: RE | Admit: 2017-02-02 | Payer: 59 | Source: Ambulatory Visit

## 2017-02-25 ENCOUNTER — Other Ambulatory Visit: Payer: Self-pay | Admitting: Orthopedic Surgery

## 2017-02-25 DIAGNOSIS — M25511 Pain in right shoulder: Secondary | ICD-10-CM

## 2017-08-25 ENCOUNTER — Other Ambulatory Visit: Payer: Self-pay | Admitting: Orthopedic Surgery

## 2017-08-25 DIAGNOSIS — M25511 Pain in right shoulder: Secondary | ICD-10-CM

## 2017-08-25 DIAGNOSIS — M5416 Radiculopathy, lumbar region: Secondary | ICD-10-CM

## 2017-09-03 ENCOUNTER — Other Ambulatory Visit: Payer: 59

## 2017-09-07 ENCOUNTER — Other Ambulatory Visit: Payer: 59

## 2017-09-11 ENCOUNTER — Ambulatory Visit
Admission: RE | Admit: 2017-09-11 | Discharge: 2017-09-11 | Disposition: A | Discharge status: Home / Self Care | Payer: 59 | Source: Ambulatory Visit | Attending: Orthopedic Surgery | Admitting: Orthopedic Surgery

## 2017-09-11 DIAGNOSIS — M5416 Radiculopathy, lumbar region: Secondary | ICD-10-CM

## 2017-09-11 DIAGNOSIS — M25511 Pain in right shoulder: Secondary | ICD-10-CM

## 2018-05-24 DIAGNOSIS — Z01818 Encounter for other preprocedural examination: Secondary | ICD-10-CM

## 2021-10-14 ENCOUNTER — Other Ambulatory Visit: Payer: Self-pay

## 2021-10-14 ENCOUNTER — Other Ambulatory Visit: Payer: Self-pay | Admitting: Family Medicine

## 2021-10-14 ENCOUNTER — Ambulatory Visit: Payer: Self-pay

## 2021-10-14 DIAGNOSIS — M545 Low back pain, unspecified: Secondary | ICD-10-CM

## 2021-10-14 DIAGNOSIS — M25531 Pain in right wrist: Secondary | ICD-10-CM

## 2021-10-14 DIAGNOSIS — M25561 Pain in right knee: Secondary | ICD-10-CM

## 2022-11-28 IMAGING — DX DG WRIST COMPLETE 3+V*R*
4 series · 4 of 4 positions shown · non-contrast
Comparison: None.

CLINICAL DATA: Right wrist pain after fall.

EXAM:
RIGHT WRIST - COMPLETE 3+ VIEW

[wrist pa]
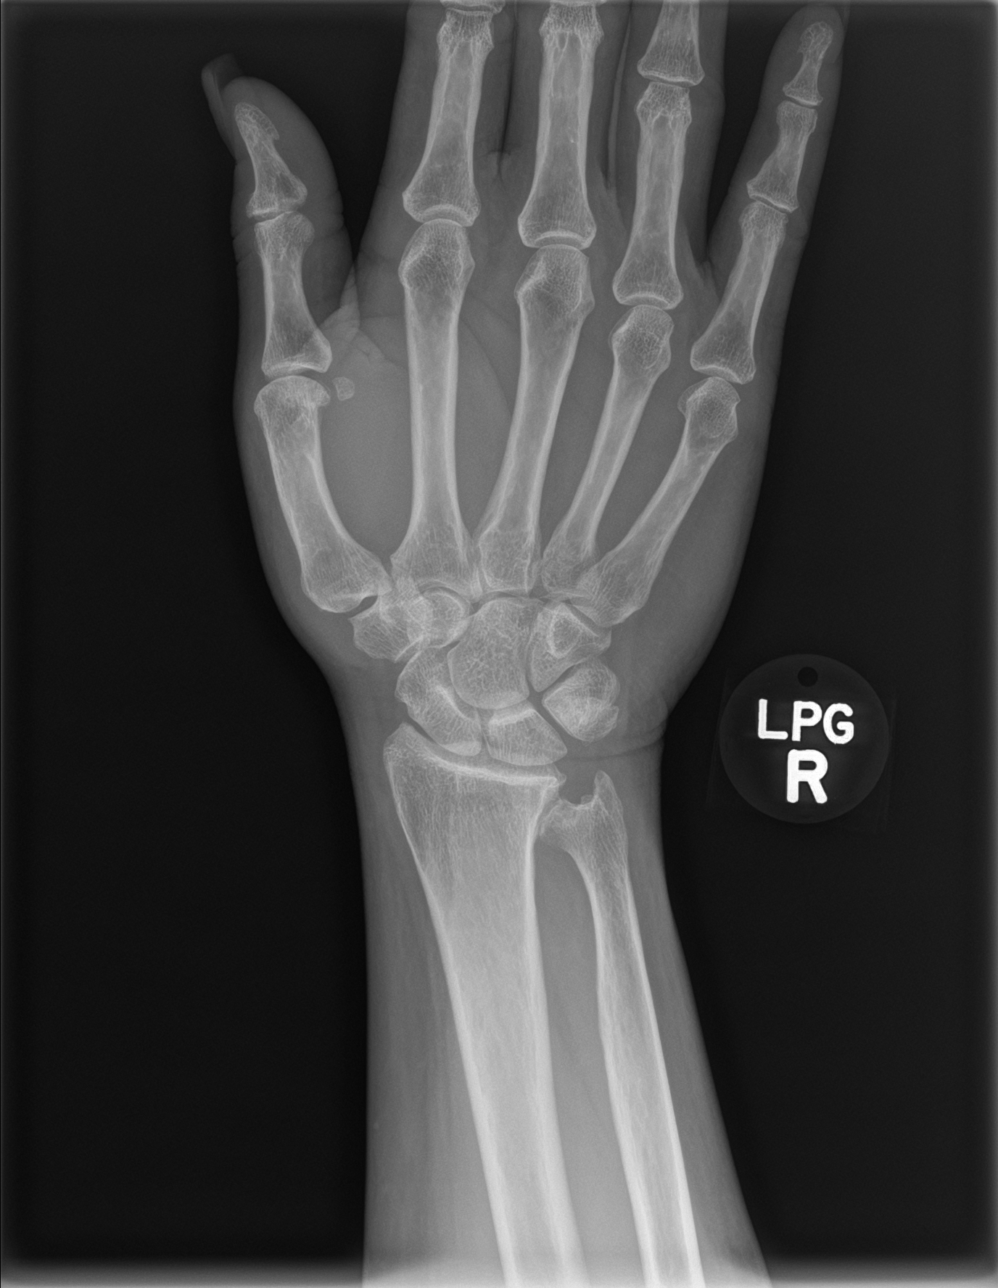

[wrist lat]
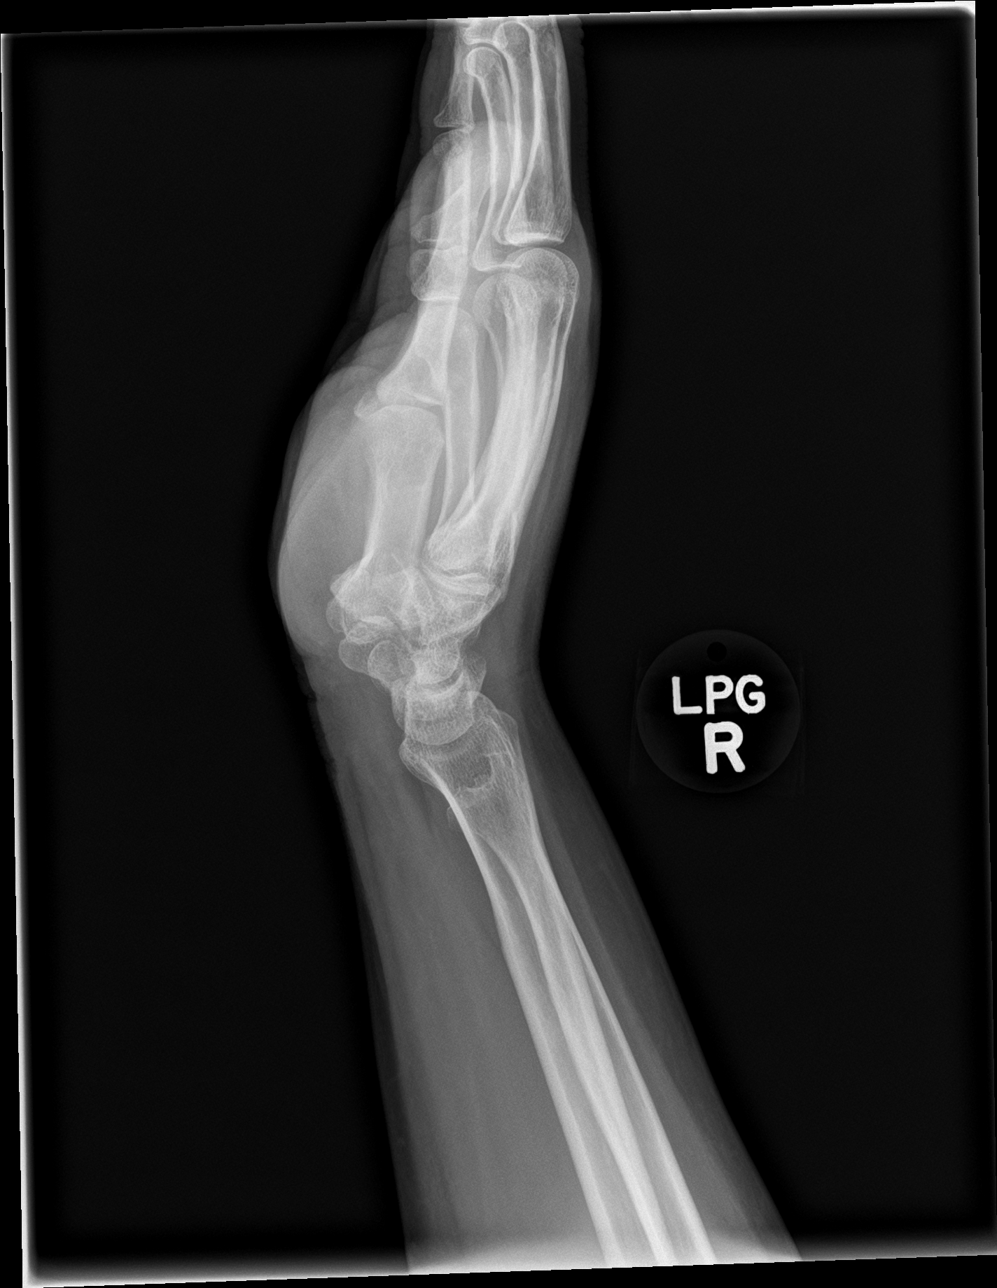

[wrist obl]
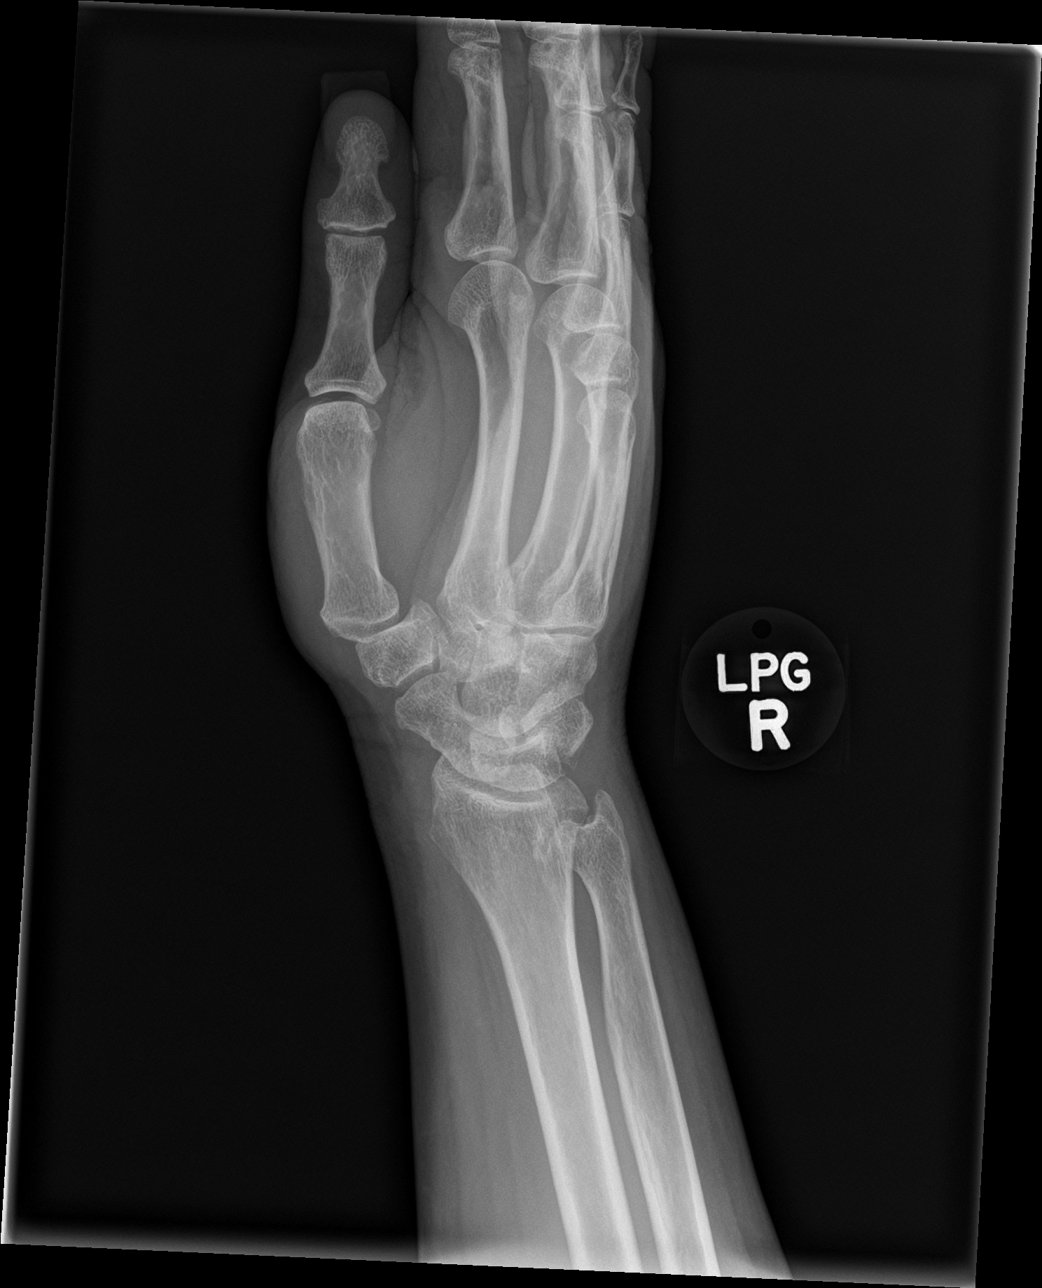

[scaphoid]
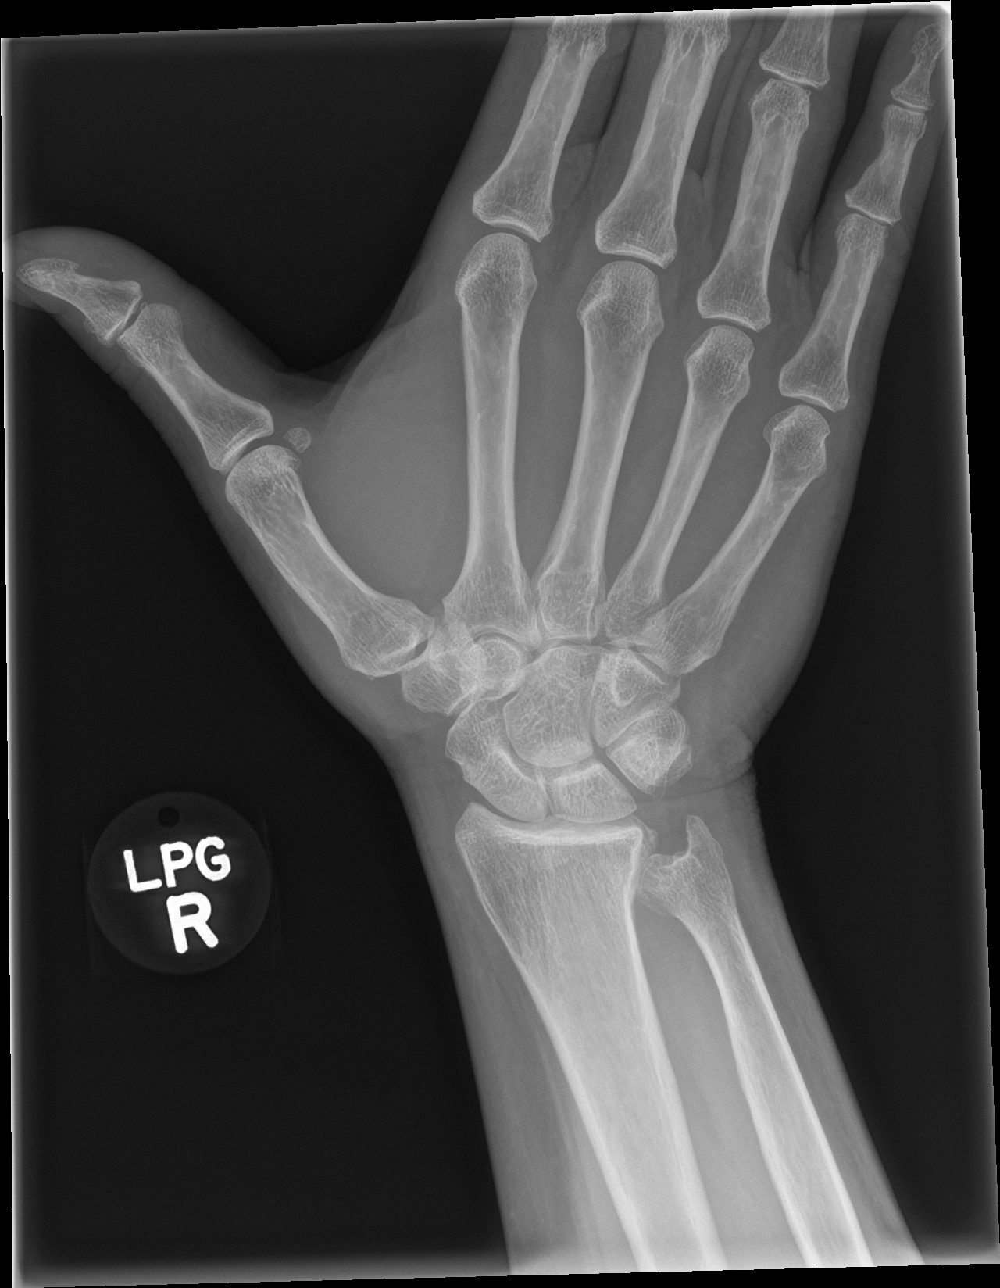

[4 of 4 positions shown; findings below may reference images not displayed]

FINDINGS: No acute fracture or dislocation. Distal radioulnar joint spurring
associated with ulnar negative variance. Subjective osteopenia.
IMPRESSION: No acute finding.

## 2023-06-21 ENCOUNTER — Ambulatory Visit: Payer: Medicaid Other | Admitting: Podiatry

## 2023-06-21 ENCOUNTER — Ambulatory Visit (INDEPENDENT_AMBULATORY_CARE_PROVIDER_SITE_OTHER): Payer: Medicaid Other

## 2023-06-21 DIAGNOSIS — M7751 Other enthesopathy of right foot: Secondary | ICD-10-CM | POA: Diagnosis not present

## 2023-06-21 DIAGNOSIS — M778 Other enthesopathies, not elsewhere classified: Secondary | ICD-10-CM

## 2023-06-21 NOTE — Progress Notes (Signed)
  Subjective:  Patient ID: Lauren Keith, female    DOB: 1965/12/27,  MRN: [id number]  Chief Complaint  Patient presents with   Foot Problem    Right 1st toe. Has pain in the toe when walking. Says it is in the tip of the toe. No recent injuries.     57 y.o. female presents With concern for pain in the right great toe.  She has pain in the toe when walking and wearing shoes.  She has pain at the tip of the toe and underneath the nail has been going on for some time.  Past Medical History:  Diagnosis Date   Acute meniscal tear of knee left knee   Arthritis KNEES AND FEET   History of gastroesophageal reflux (GERD)    Lumbar disc herniation with radiculopathy    Plantar fasciitis, bilateral     Allergies  Allergen Reactions   Hydrocodone-Acetaminophen Other (See Comments)    Hallucinations.    ROS: Negative except as per HPI above  Objective:  General: AAO x3, NAD  Dermatological: Right hallux nail with nail polish unable to assess underneath.  Does appear to be some dystrophy thickening.  Patient does have pain with palpation of the distal aspect of the toe and pressure on the nail from above  Vascular:  Dorsalis Pedis artery and Posterior Tibial artery pedal pulses are 2/4 bilateral.  Capillary fill time < 3 sec to all digits.   Neruologic: Grossly intact via light touch bilateral. Protective threshold intact to all sites bilateral.   Musculoskeletal: No gross boney pedal deformities bilateral. No pain, crepitus, or limitation noted with foot and ankle range of motion bilateral. Muscular strength 5/5 in all groups tested bilateral.  Gait: Unassisted, Nonantalgic.   No images are attached to the encounter.  Radiographs:  Date: 06/21/2023 XR the right foot Weightbearing AP/Lateral/Oblique   Findings: On the lateral view attention directed to the distal phalanx of the hallux there is noted to be dorsal distal osseous spur protruding from the dorsal distal aspect of the  distal phalanx. Assessment:   1. Bone spur of toe of right foot      Plan:  Patient was evaluated and treated and all questions answered.  # Bone spur of distal phalanx of right foot -Discussed with patient she does have evidence of a painful present at the distal aspect of the right distal phalanx dorsally -Discussed conservative options would be removal of the nail however I do not believe this will help with her pain completely or permanently as the nail would regrow in the bone spur with any irritate the skin underlying again -Surgical intervention would include right hallux total nail avulsion with bone spur resection from the distal phalanx. -Discussed risk benefits alternatives and possible complications associate with this procedure as well as expected postoperative recovery course -Patient wishes to proceed we will begin surgical planning for possible consent was obtained at this visit.  No follow-ups on file.          Corinna Gab, DPM [facility] / [facility]

## 2023-06-24 ENCOUNTER — Other Ambulatory Visit (HOSPITAL_COMMUNITY): Payer: Self-pay

## 2023-06-25 ENCOUNTER — Other Ambulatory Visit (HOSPITAL_BASED_OUTPATIENT_CLINIC_OR_DEPARTMENT_OTHER): Payer: Self-pay

## 2023-07-05 ENCOUNTER — Telehealth: Payer: Self-pay | Admitting: Podiatry

## 2023-07-05 NOTE — Telephone Encounter (Signed)
DOS - 07/20/2023  EXOSTECTOMY [id number]  [employer] EFFECTIVE DATE- 08/09/2022  DEDUCTIBLE- $0.00 WITH REMAINING $0.00 OOP- Member's individual out-of-pocket maximum has no limit. COINSURANCE- 0%   PER THE [employer] WEBSITE PORTAL, PRIOR AUTH HAS BEEN APPROVED FOR CPT CODE 45409, GOOD FROM 07/20/2023 - 10/18/2023.  AUTH REFERENCE NUMBER: [id number]

## 2023-07-14 ENCOUNTER — Telehealth: Payer: Self-pay | Admitting: Podiatry

## 2023-07-14 NOTE — Telephone Encounter (Signed)
Pt called stating that she would like to push her 12/11 surgery to sometime in January with Dr.Standiford. The best number to reach her [phone].

## 2023-07-26 ENCOUNTER — Other Ambulatory Visit (HOSPITAL_COMMUNITY): Payer: Self-pay

## 2023-07-26 ENCOUNTER — Encounter: Payer: Medicaid Other | Admitting: Podiatry

## 2023-07-26 MED ORDER — OXYCODONE HCL 15 MG PO TABS
15.0000 mg | ORAL_TABLET | Freq: Three times a day (TID) | ORAL | 0 refills | Status: DC
  Filled 2023-07-26: qty 90, 30d supply, fill #0

## 2023-07-27 ENCOUNTER — Other Ambulatory Visit (HOSPITAL_COMMUNITY): Payer: Self-pay

## 2023-07-28 ENCOUNTER — Ambulatory Visit: Payer: Medicaid Other | Admitting: Podiatry

## 2023-07-28 ENCOUNTER — Encounter: Payer: Self-pay | Admitting: Podiatry

## 2023-07-28 ENCOUNTER — Ambulatory Visit: Payer: Medicaid Other

## 2023-07-28 DIAGNOSIS — M7752 Other enthesopathy of left foot: Secondary | ICD-10-CM

## 2023-07-28 DIAGNOSIS — M7751 Other enthesopathy of right foot: Secondary | ICD-10-CM

## 2023-07-28 DIAGNOSIS — M722 Plantar fascial fibromatosis: Secondary | ICD-10-CM | POA: Diagnosis not present

## 2023-07-28 DIAGNOSIS — L603 Nail dystrophy: Secondary | ICD-10-CM

## 2023-07-28 NOTE — Progress Notes (Signed)
Subjective:  Patient ID: Lauren Keith, female    DOB: 08-Oct-1965,  MRN: [id number]  Chief Complaint  Patient presents with   Foot Pain    "When I'm walking or bend it a certain way, it hurts.  The left one is starting to do the same thing."    57 y.o. female presents for concern for pain in the left great toe.  Previously was seen in diagnosed with bone spur on the right great toe underneath the nail.  Discussion was made and decision was made to perform total nail avulsion with bone spur resection on the right side.  Initially procedure was scheduled for earlier this month however was pushed into January.  Patient states she now has pain on the left great toe in the same area as the right and wants to get that evaluated prior to having her procedure done on the right foot so that we might be able to do both at the same time if possible.  Past Medical History:  Diagnosis Date   Acute meniscal tear of knee left knee   Arthritis KNEES AND FEET   History of gastroesophageal reflux (GERD)    Lumbar disc herniation with radiculopathy    Plantar fasciitis, bilateral     Allergies  Allergen Reactions   Hydrocodone-Acetaminophen Other (See Comments)    Hallucinations.    ROS: Negative except as per HPI above  Objective:  General: AAO x3, NAD  Dermatological: Pain in the left great toe with pressure on the nail.  No severe abnormality of the nail however does appear slightly thickened  Vascular:  Dorsalis Pedis artery and Posterior Tibial artery pedal pulses are 2/4 bilateral.  Capillary fill time < 3 sec to all digits.   Neruologic: Grossly intact via light touch bilateral. Protective threshold intact to all sites bilateral.   Musculoskeletal: Pain with palpation of the distal aspect of the left hallux similar to the right hallux.  Gait: Unassisted, Nonantalgic.   No images are attached to the encounter.  Radiographs:  Date: 07/28/2023 XR the left foot Weightbearing  AP/Lateral/Oblique   Findings: On the lateral view attention directed to the distal phalanx of the hallux there is noted to be dorsal distal osseous spur protruding from the dorsal distal aspect of the distal phalanx. Assessment:   1. Bone spur of toe of left foot   2. Bone spur of toe of right foot   3. Nail dystrophy       Plan:  Patient was evaluated and treated and all questions answered.  # Bone spur of distal phalanx of Bilateral foot -Discussed with patient she does have evidence of a painful present at the distal aspect of the left distal phalanx dorsally, similar to the right side though not as severe -Pt wants to get both done at the same time Agree we can perform bilateral surgery, wont affect weightbearing too much just be in bilateral post op shoe -Surgical intervention would include bilateral hallux total nail avulsion with bone spur resection from the distal phalanx. -Discussed risk benefits alternatives and possible complications associate with this procedure as well as expected postoperative recovery course -Patient wishes to proceed we will begin surgical planning scheduled for August 24, 2023, updated consent was obtained at this visit.         Corinna Gab, DPM [facility] / [facility]

## 2023-08-17 ENCOUNTER — Other Ambulatory Visit: Payer: Self-pay | Admitting: Neurological Surgery

## 2023-08-17 DIAGNOSIS — M5416 Radiculopathy, lumbar region: Secondary | ICD-10-CM

## 2023-08-18 ENCOUNTER — Encounter: Payer: Self-pay | Admitting: Neurological Surgery

## 2023-08-23 ENCOUNTER — Other Ambulatory Visit (HOSPITAL_COMMUNITY): Payer: Self-pay

## 2023-08-23 MED ORDER — OXYCODONE HCL 15 MG PO TABS
15.0000 mg | ORAL_TABLET | Freq: Three times a day (TID) | ORAL | 0 refills | Status: DC
  Filled 2023-08-23 – 2023-08-29 (×6): qty 90, 30d supply, fill #0

## 2023-08-24 ENCOUNTER — Other Ambulatory Visit: Payer: Self-pay | Admitting: Podiatry

## 2023-08-24 ENCOUNTER — Encounter: Payer: Self-pay | Admitting: Neurological Surgery

## 2023-08-24 DIAGNOSIS — M216X1 Other acquired deformities of right foot: Secondary | ICD-10-CM | POA: Diagnosis not present

## 2023-08-24 DIAGNOSIS — M216X2 Other acquired deformities of left foot: Secondary | ICD-10-CM | POA: Diagnosis not present

## 2023-08-24 DIAGNOSIS — L600 Ingrowing nail: Secondary | ICD-10-CM | POA: Diagnosis not present

## 2023-08-24 MED ORDER — CEPHALEXIN 500 MG PO CAPS: 500.0000 mg | ORAL_CAPSULE | Freq: Three times a day (TID) | ORAL | 0 refills | Status: AC

## 2023-08-24 MED ORDER — OXYCODONE-ACETAMINOPHEN 5-325 MG PO TABS: 1.0000 | ORAL_TABLET | ORAL | 0 refills | Status: AC | PRN

## 2023-08-24 NOTE — Progress Notes (Signed)
 Post-op meds sent.

## 2023-08-25 ENCOUNTER — Encounter: Payer: Self-pay | Admitting: Podiatry

## 2023-08-25 ENCOUNTER — Other Ambulatory Visit (HOSPITAL_COMMUNITY): Payer: Self-pay

## 2023-08-25 ENCOUNTER — Ambulatory Visit (INDEPENDENT_AMBULATORY_CARE_PROVIDER_SITE_OTHER): Payer: Medicaid Other | Admitting: Podiatry

## 2023-08-25 ENCOUNTER — Ambulatory Visit: Payer: Medicaid Other

## 2023-08-25 VITALS — Ht 68.0 in | Wt 260.0 lb

## 2023-08-25 DIAGNOSIS — M7751 Other enthesopathy of right foot: Secondary | ICD-10-CM

## 2023-08-25 MED ORDER — KETOROLAC TROMETHAMINE 10 MG PO TABS: 10.0000 mg | ORAL_TABLET | Freq: Four times a day (QID) | ORAL | 0 refills | Status: AC | PRN

## 2023-08-25 MED ORDER — GABAPENTIN 300 MG PO CAPS: 300.0000 mg | ORAL_CAPSULE | Freq: Three times a day (TID) | ORAL | 3 refills | Status: AC

## 2023-08-25 NOTE — Progress Notes (Signed)
No active bleeding noted.  Dressing was changed.  Surgicel was applied.  Gabapentin and Toradol was sent.  Patient had clearance for Toradol from her GI doctor

## 2023-08-26 ENCOUNTER — Other Ambulatory Visit (HOSPITAL_COMMUNITY): Payer: Self-pay

## 2023-08-29 ENCOUNTER — Other Ambulatory Visit (HOSPITAL_COMMUNITY): Payer: Self-pay

## 2023-08-30 ENCOUNTER — Encounter: Payer: Self-pay | Admitting: Podiatry

## 2023-08-30 ENCOUNTER — Ambulatory Visit (INDEPENDENT_AMBULATORY_CARE_PROVIDER_SITE_OTHER): Payer: Medicaid Other | Admitting: Podiatry

## 2023-08-30 ENCOUNTER — Ambulatory Visit (INDEPENDENT_AMBULATORY_CARE_PROVIDER_SITE_OTHER): Payer: Medicaid Other

## 2023-08-30 ENCOUNTER — Encounter: Payer: Medicaid Other | Admitting: Podiatry

## 2023-08-30 VITALS — Ht 68.0 in | Wt 260.0 lb

## 2023-08-30 DIAGNOSIS — M7751 Other enthesopathy of right foot: Secondary | ICD-10-CM

## 2023-08-30 DIAGNOSIS — Z9889 Other specified postprocedural states: Secondary | ICD-10-CM

## 2023-08-30 NOTE — Progress Notes (Signed)
  Subjective:  Patient ID: Lauren Keith, female    DOB: [birth year],  MRN: [id number]  Chief Complaint  Patient presents with   Routine Post Op    RM6: POV#1: Bone spur of toe of right foot     DOS: 08/24/2023 Procedure: Bilateral great toe total nail avulsion with dorsal distal phalanx exostectomy  58 y.o. female seen for post op check.  Patient reports pain in both great toes.  Has kept dressings clean dry and intact as instructed.  She did have some bleeding immediately postop had to be seen by Dr. Jamse Arn who will change her dressings.  She reports since then she has been doing better still having a lot of pain.  Walking in postop shoes.  Review of Systems: Negative except as noted in the HPI. Denies N/V/F/Ch.   Objective:  There were no vitals filed for this visit. Body mass index is 39.53 kg/m. Constitutional Well developed. Well nourished.  Vascular Foot warm and well perfused. Capillary refill normal to all digits.   No calf pain with palpation  Neurologic Normal speech. Oriented to person, place, and time. Epicritic sensation intact  Dermatologic Sutures intact to the bilateral hallux nail bed.  There is no active drainage or bleeding.  No evidence of infection.  Overall healing well with tenderness to palpation noted.   Orthopedic: Status post bilateral hallux nail avulsion edema to the great toes.   Radiographs: Deferred.  Immediate postop films show resection of dorsal spurs  Pathology: N/A  Micro: N/A  Assessment:   1. Bone spur of toe of right foot   2. Post-operative state    Plan:  Patient was evaluated and treated and all questions answered.  POD # 6 s/p bilateral hallux total nail avulsion with distal phalanx exostectomy -Progressing as expected postop, no evidence of infection at bilateral hallux nail avulsion site.  Incision in the nailbed is intact and no dehiscence is noted. -XR: Deferred at this visit -WB Status: Weightbearing as tolerated in  in bilateral postop shoe, okay to transition back to regular shoes as able if they do not bother her and put pressure on the nail beds -Sutures: To remain intact. -Medications/ABX: Finish course of antibiotics and no further indicated -Foot redressed.  Begin Epsom salt soaks.  Then apply Band-Aid antibiotic ointment and Band-Aid - Return in 2 weeks        Corinna Gab, DPM [facility] / [facility]

## 2023-08-30 NOTE — Patient Instructions (Signed)

## 2023-09-02 ENCOUNTER — Ambulatory Visit
Admission: RE | Admit: 2023-09-02 | Discharge: 2023-09-02 | Disposition: A | Discharge status: Home / Self Care | Payer: Medicaid Other | Source: Ambulatory Visit | Attending: Neurological Surgery | Admitting: Neurological Surgery

## 2023-09-02 DIAGNOSIS — M5416 Radiculopathy, lumbar region: Secondary | ICD-10-CM

## 2023-09-05 ENCOUNTER — Telehealth: Payer: Self-pay | Admitting: Podiatry

## 2023-09-05 NOTE — Telephone Encounter (Signed)
Pt called and had surgery with you 1/15 and still having swelling,pain and drainage. Also concerned about stitches do they come out on there own.  I scheduled her to see you tomorrow incase and if we need to we can cancel. She has her normal post op on 2/4 still scheduled as well.

## 2023-09-06 ENCOUNTER — Ambulatory Visit: Payer: Medicaid Other | Admitting: Podiatry

## 2023-09-06 NOTE — Telephone Encounter (Signed)
Pt called and cxled appt stated feeling better

## 2023-09-13 ENCOUNTER — Ambulatory Visit (INDEPENDENT_AMBULATORY_CARE_PROVIDER_SITE_OTHER): Payer: Medicaid Other | Admitting: Podiatry

## 2023-09-13 DIAGNOSIS — Z9889 Other specified postprocedural states: Secondary | ICD-10-CM

## 2023-09-13 DIAGNOSIS — M7752 Other enthesopathy of left foot: Secondary | ICD-10-CM

## 2023-09-13 DIAGNOSIS — M7751 Other enthesopathy of right foot: Secondary | ICD-10-CM

## 2023-09-13 NOTE — Progress Notes (Signed)
  Subjective:  Patient ID: Lauren Keith, female    DOB: [birth year],  MRN: [id number]  No chief complaint on file.   DOS: 08/24/2023 Procedure: Bilateral great toe total nail avulsion with dorsal distal phalanx exostectomy  58 y.o. female seen for post op check.   3 weeks s/p above proc. She reports she is doing better though still with a lot of pain especially in the left great toe. Has been doing epsom salt soaks. Wearing crocs.   Review of Systems: Negative except as noted in the HPI. Denies N/V/F/Ch.   Objective:  There were no vitals filed for this visit. There is no height or weight on file to calculate BMI. Constitutional Well developed. Well nourished.  Vascular Foot warm and well perfused. Capillary refill normal to all digits.   No calf pain with palpation  Neurologic Normal speech. Oriented to person, place, and time. Epicritic sensation intact  Dermatologic Sutures intact to the bilateral hallux nail bed. Nail beds are improved at this time with more healing from prior, increased healing. No drainage erythema, mild edema bilateral  Orthopedic: Status post bilateral hallux nail avulsion edema to the great toes.   Radiographs: Deferred.  Immediate postop films show resection of dorsal spurs  Pathology: N/A  Micro: N/A  Assessment:   1. Bone spur of toe of right foot   2. Bone spur of toe of left foot   3. Post-operative state     Plan:  Patient was evaluated and treated and all questions answered.  3 wks s/p bilateral hallux total nail avulsion with distal phalanx exostectomy -Progressing as expected postop, no evidence of infection at bilateral hallux nail avulsion site.  - Overall improving, remove sutures which I believe will help with her pain -Continue Epsom salt soaks for another week.  Does not need to put antibiotic ointment on or dressing that they are not draining -XR: Deferred at this visit -WB Status: Weightbearing as tolerated in reg shoe  wear -Sutures: removed today in total -Medications/ABX: Recommend 7 more days of doxycycline  which she states she has. -Follow-up in 3 weeks for further recheck to ensure pain is continuing to decrease we will consider new x-rays at that time         Lauren Keith, DPM [facility] / [facility]

## 2023-09-22 ENCOUNTER — Other Ambulatory Visit (HOSPITAL_COMMUNITY): Payer: Self-pay

## 2023-09-22 MED ORDER — OXYCODONE HCL 15 MG PO TABS
15.0000 mg | ORAL_TABLET | Freq: Three times a day (TID) | ORAL | 0 refills | Status: DC
  Filled 2023-09-22 – 2023-09-27 (×2): qty 90, 30d supply, fill #0

## 2023-09-27 ENCOUNTER — Other Ambulatory Visit: Payer: Self-pay

## 2023-09-27 ENCOUNTER — Other Ambulatory Visit (HOSPITAL_COMMUNITY): Payer: Self-pay

## 2023-10-04 ENCOUNTER — Encounter: Payer: Self-pay | Admitting: Podiatry

## 2023-10-04 ENCOUNTER — Ambulatory Visit (INDEPENDENT_AMBULATORY_CARE_PROVIDER_SITE_OTHER): Payer: Medicaid Other | Admitting: Podiatry

## 2023-10-04 DIAGNOSIS — Z9889 Other specified postprocedural states: Secondary | ICD-10-CM

## 2023-10-04 DIAGNOSIS — M7751 Other enthesopathy of right foot: Secondary | ICD-10-CM

## 2023-10-04 DIAGNOSIS — M7752 Other enthesopathy of left foot: Secondary | ICD-10-CM

## 2023-10-04 NOTE — Progress Notes (Signed)
  Subjective:  Patient ID: Lauren Keith, female    DOB: 12/24/1965,  MRN: [id number]  Chief Complaint  Patient presents with   Nail Problem    POV #3 DOS 08/24/2023 --- RIGHT FOOT GREAT TOE BON SPUR EXCISION AND TOTAL NAIL AVULSION    DOS: 08/24/2023 Procedure: Bilateral great toe total nail avulsion with dorsal distal phalanx exostectomy  58 y.o. female seen for post op check.   6 weeks s/p above proc.  She is doing much better now no drainage.  Ready okay she is gone back to room wearing regular shoes.    Review of Systems: Negative except as noted in the HPI. Denies N/V/F/Ch.   Objective:  There were no vitals filed for this visit. There is no height or weight on file to calculate BMI. Constitutional Well developed. Well nourished.  Vascular Foot warm and well perfused. Capillary refill normal to all digits.   No calf pain with palpation  Neurologic Normal speech. Oriented to person, place, and time. Epicritic sensation intact  Dermatologic Nail beds bilateral hallux with dry callus tissue early regrowth of the nail.  No significant pain on palpation  Orthopedic: Status post bilateral hallux nail avulsion edema to the great toes.   Radiographs: Deferred.  Immediate postop films show resection of dorsal spurs  Pathology: N/A  Micro: N/A  Assessment:   1. Bone spur of toe of right foot   2. Bone spur of toe of left foot   3. Post-operative state      Plan:  Patient was evaluated and treated and all questions answered.  3 wks s/p bilateral hallux total nail avulsion with distal phalanx exostectomy -Improved at this time both nails appear to be healing with no pain and there is some callus tissue in the nailbed. -I debrided the callus with a Dremel tool and saline without issue. -No further dressings or Epsom salt soaks needed do recommend lotion for the nails to prevent callus formation okay to use a Dremel and lightly debride any callus that builds up -XR:  Deferred at this visit -WB Status: Weightbearing as tolerated in reg shoe wear -Medications/ABX: No evidence of infection no indication for antibiotics -Follow-up in 8 weeks to evaluate as the nails regrow         Corinna Gab, DPM [facility] / [facility]

## 2023-10-24 ENCOUNTER — Other Ambulatory Visit (HOSPITAL_COMMUNITY): Payer: Self-pay

## 2023-10-24 MED ORDER — OXYCODONE HCL 15 MG PO TABS
15.0000 mg | ORAL_TABLET | Freq: Three times a day (TID) | ORAL | 0 refills | Status: DC
Start: 2023-10-24 — End: 2023-11-24
  Filled 2023-10-24: qty 90, 30d supply, fill #0
  Filled 2023-10-24: qty 20, 7d supply, fill #0
  Filled 2023-10-24 – 2023-10-25 (×2): qty 90, 30d supply, fill #0

## 2023-10-25 ENCOUNTER — Other Ambulatory Visit (HOSPITAL_COMMUNITY): Payer: Self-pay

## 2023-11-24 ENCOUNTER — Other Ambulatory Visit (HOSPITAL_COMMUNITY): Payer: Self-pay

## 2023-11-24 ENCOUNTER — Other Ambulatory Visit (HOSPITAL_BASED_OUTPATIENT_CLINIC_OR_DEPARTMENT_OTHER): Payer: Self-pay

## 2023-11-24 MED ORDER — OXYCODONE HCL 15 MG PO TABS
15.0000 mg | ORAL_TABLET | Freq: Three times a day (TID) | ORAL | 0 refills | Status: DC
  Filled 2023-11-24 – 2023-11-25 (×4): qty 90, 30d supply, fill #0

## 2023-11-25 ENCOUNTER — Other Ambulatory Visit (HOSPITAL_COMMUNITY): Payer: Self-pay

## 2023-11-25 ENCOUNTER — Other Ambulatory Visit (HOSPITAL_BASED_OUTPATIENT_CLINIC_OR_DEPARTMENT_OTHER): Payer: Self-pay

## 2023-12-06 ENCOUNTER — Encounter: Payer: Self-pay | Admitting: Podiatry

## 2023-12-06 ENCOUNTER — Ambulatory Visit (INDEPENDENT_AMBULATORY_CARE_PROVIDER_SITE_OTHER): Payer: Medicaid Other | Admitting: Podiatry

## 2023-12-06 VITALS — Ht 68.0 in | Wt 260.0 lb

## 2023-12-06 DIAGNOSIS — M7751 Other enthesopathy of right foot: Secondary | ICD-10-CM

## 2023-12-06 DIAGNOSIS — Z9889 Other specified postprocedural states: Secondary | ICD-10-CM

## 2023-12-06 DIAGNOSIS — M7752 Other enthesopathy of left foot: Secondary | ICD-10-CM

## 2023-12-06 NOTE — Progress Notes (Signed)
  Subjective:  Patient ID: Lauren Keith, female    DOB: 1966/07/17,  MRN: [id number]  DOS: 08/24/2023 Procedure: Bilateral great toe total nail avulsion with dorsal distal phalanx exostectomy  58 y.o. female seen for post op check.   14 weeks s/p above proc.  Patient reports she is doing well she denies pain at this time.  Says nails been regrowing.  Has some thickness and occasional twinge of pain on the right great toe but otherwise much better than preoperative.  No concerns on her behalf.  She is applying tea tree oil after her showers at night to both great toenails.  Review of Systems: Negative except as noted in the HPI. Denies N/V/F/Ch.   Objective:  There were no vitals filed for this visit. There is no height or weight on file to calculate BMI. Constitutional Well developed. Well nourished.  Vascular Foot warm and well perfused. Capillary refill normal to all digits.   No calf pain with palpation  Neurologic Normal speech. Oriented to person, place, and time. Epicritic sensation intact  Dermatologic Nail beds bilateral hallux improved from prior no significant pain on palpation nails are growing with some thickness bilaterally though overall relief.  Approximately 50% nail regrowth at this time  Orthopedic: Status post bilateral hallux nail avulsion, no further edema to the great toes.   Radiographs: Deferred.  Immediate postop films show resection of dorsal spurs  Pathology: N/A  Micro: N/A  Assessment:   1. Bone spur of toe of right foot   2. Bone spur of toe of left foot   3. Post-operative state       Plan:  Patient was evaluated and treated and all questions answered.  14 wks s/p bilateral hallux total nail avulsion with distal phalanx exostectomy -Improved at this time, much better than preoperative nails are regrowing appropriately for the course - Consults okay as needed - Recommend filing the bilateral hallux nails to prevent thickness and trim off  any dystrophic nail tissue distally -XR: Deferred at this visit -WB Status: Weightbearing as tolerated in reg shoe wear -Medications/ABX: No evidence of infection no indication for antibiotics -Follow-up  prn         Maridee Shoemaker, DPM [facility] / [facility]

## 2023-12-22 ENCOUNTER — Other Ambulatory Visit (HOSPITAL_COMMUNITY): Payer: Self-pay

## 2023-12-22 MED ORDER — OXYCODONE HCL 15 MG PO TABS
15.0000 mg | ORAL_TABLET | Freq: Three times a day (TID) | ORAL | 0 refills | Status: AC
Start: 2023-12-22 — End: ?
  Filled 2023-12-26: qty 90, 30d supply, fill #0

## 2023-12-22 MED ORDER — METHOCARBAMOL 500 MG PO TABS
500.0000 mg | ORAL_TABLET | Freq: Three times a day (TID) | ORAL | 0 refills | Status: DC
  Filled 2023-12-22: qty 90, 30d supply, fill #0

## 2023-12-23 ENCOUNTER — Other Ambulatory Visit (HOSPITAL_COMMUNITY): Payer: Self-pay

## 2023-12-26 ENCOUNTER — Other Ambulatory Visit (HOSPITAL_COMMUNITY): Payer: Self-pay

## 2024-01-20 ENCOUNTER — Other Ambulatory Visit (HOSPITAL_COMMUNITY): Payer: Self-pay

## 2024-01-20 MED ORDER — OXYCODONE HCL 15 MG PO TABS
15.0000 mg | ORAL_TABLET | Freq: Three times a day (TID) | ORAL | 0 refills | Status: DC
  Filled 2024-01-23: qty 90, 30d supply, fill #0

## 2024-01-23 ENCOUNTER — Other Ambulatory Visit (HOSPITAL_COMMUNITY): Payer: Self-pay

## 2024-01-26 ENCOUNTER — Other Ambulatory Visit (HOSPITAL_COMMUNITY): Payer: Self-pay

## 2024-02-23 ENCOUNTER — Other Ambulatory Visit (HOSPITAL_COMMUNITY): Payer: Self-pay

## 2024-02-23 MED ORDER — OXYCODONE HCL 15 MG PO TABS
15.0000 mg | ORAL_TABLET | Freq: Three times a day (TID) | ORAL | 0 refills | Status: DC
  Filled 2024-02-23: qty 90, 30d supply, fill #0

## 2024-02-23 MED ORDER — METHOCARBAMOL 500 MG PO TABS
500.0000 mg | ORAL_TABLET | Freq: Three times a day (TID) | ORAL | 0 refills | Status: DC
  Filled 2024-02-23: qty 90, 30d supply, fill #0

## 2024-02-27 ENCOUNTER — Other Ambulatory Visit (HOSPITAL_COMMUNITY): Payer: Self-pay

## 2024-03-26 ENCOUNTER — Other Ambulatory Visit (HOSPITAL_COMMUNITY): Payer: Self-pay

## 2024-03-26 MED ORDER — OXYCODONE HCL 15 MG PO TABS
15.0000 mg | ORAL_TABLET | Freq: Four times a day (QID) | ORAL | 0 refills | Status: DC
  Filled 2024-03-26: qty 120, 30d supply, fill #0

## 2024-03-26 MED ORDER — METHOCARBAMOL 500 MG PO TABS
500.0000 mg | ORAL_TABLET | Freq: Three times a day (TID) | ORAL | 0 refills | Status: AC
  Filled 2024-03-26 – 2024-08-14 (×2): qty 90, 30d supply, fill #0

## 2024-03-28 ENCOUNTER — Ambulatory Visit (INDEPENDENT_AMBULATORY_CARE_PROVIDER_SITE_OTHER): Admitting: Podiatry

## 2024-03-28 ENCOUNTER — Other Ambulatory Visit (HOSPITAL_COMMUNITY): Payer: Self-pay

## 2024-03-28 DIAGNOSIS — L600 Ingrowing nail: Secondary | ICD-10-CM

## 2024-03-28 NOTE — Patient Instructions (Signed)

## 2024-03-28 NOTE — Progress Notes (Signed)
 Subjective:  Patient ID: Lauren Keith, female    DOB: 11-05-1965,  MRN: [id number]  Chief Complaint  Patient presents with   Nail Problem    58 y.o. female presents with the above complaint.  Patient presents with bilateral hallux medial border ingrown painful to touch has progressed gotten worse worse with ambulation or shoe pressure patient would like to discuss treatment options for this pain scale 7 out of 10 dull aching nature she would like to have removed.  She does not want treated conservatively.   Review of Systems: Negative except as noted in the HPI. Denies N/V/F/Ch.  Past Medical History:  Diagnosis Date   Acute meniscal tear of knee left knee   Arthritis KNEES AND FEET   History of gastroesophageal reflux (GERD)    Lumbar disc herniation with radiculopathy    Plantar fasciitis, bilateral     Current Outpatient Medications:    albuterol (VENTOLIN HFA) 108 (90 Base) MCG/ACT inhaler, 1 puff as needed Inhalation every 4 hrs for 90 days, Disp: , Rfl:    cyclobenzaprine (FLEXERIL) 10 MG tablet, 1 tablet at bedtime as needed Orally Once a day for 30 day(s), Disp: , Rfl:    diazepam (VALIUM) 10 MG tablet, , Disp: , Rfl:    Dulaglutide (TRULICITY) 1.5 MG/0.5ML SOAJ, 1.5 mg Subcutaneous Once a week for 28 days, Disp: , Rfl:    gabapentin  (NEURONTIN ) 100 MG capsule, 300 mg., Disp: , Rfl:    gabapentin  (NEURONTIN ) 300 MG capsule, Take 1 capsule (300 mg total) by mouth 3 (three) times daily., Disp: 90 capsule, Rfl: 3   HM LIDOCAINE  PATCH EX, Apply topically., Disp: , Rfl:    ketorolac  (TORADOL ) 10 MG tablet, Take 1 tablet (10 mg total) by mouth every 6 (six) hours as needed., Disp: 20 tablet, Rfl: 0   lisdexamfetamine (VYVANSE) 70 MG capsule, 1 capsule in the morning Orally Once a day for 30 days, Disp: , Rfl:    methocarbamol  (ROBAXIN ) 500 MG tablet, Take 1 tablet (500 mg total) by mouth 3 (three) times daily., Disp: 90 tablet, Rfl: 0   methocarbamol  (ROBAXIN ) 750 MG tablet,  Take 750 mg by mouth every 4 (four) hours., Disp: , Rfl:    metoprolol tartrate (LOPRESSOR) 50 MG tablet, 1 tablet with food Orally Twice a day for 90 days, Disp: , Rfl:    Multiple Vitamin (MULTIVITAMIN) capsule, Take by mouth., Disp: , Rfl:    naloxone (NARCAN) nasal spray 4 mg/0.1 mL, SMARTSIG:Both Nares, Disp: , Rfl:    nystatin (MYCOSTATIN/NYSTOP) powder, Apply 1 Application topically 3 (three) times daily., Disp: , Rfl:    nystatin cream (MYCOSTATIN), Apply 1 Application topically 2 (two) times daily., Disp: , Rfl:    oxybutynin (DITROPAN XL) 15 MG 24 hr tablet, 1 tablet Orally Once a day for 90 days, Disp: , Rfl:    oxyCODONE  (ROXICODONE ) 15 MG immediate release tablet, Take 1 tablet (15 mg total) by mouth 4 (four) times daily., Disp: 120 tablet, Rfl: 0   oxyCODONE  (ROXICODONE ) 5 MG immediate release tablet, 1-2 tablets every 4-6 hrs as needed for pain (Patient taking differently: Take 10 mg by mouth 3 (three) times daily as needed. 1-2 tablets every 4-6 hrs as needed for pain), Disp: 35 tablet, Rfl: 0   SYNTHROID 50 MCG tablet, 1 tablet on an empty stomach in the morning Orally Once a day for 90 days, Disp: , Rfl:    topiramate (TOPAMAX) 100 MG tablet, TK 1 T PO QD, Disp: ,  Rfl:    traZODone (DESYREL) 150 MG tablet, Take 150 mg by mouth at bedtime., Disp: , Rfl:    triamcinolone (KENALOG) 0.025 % cream, Apply 1 Application topically 2 (two) times daily., Disp: , Rfl:    Vitamin D, Ergocalciferol, (DRISDOL) 1.25 MG (50000 UNIT) CAPS capsule, Take 50,000 Units by mouth every 7 (seven) days., Disp: , Rfl:   Social History   Tobacco Use  Smoking Status Never  Smokeless Tobacco Never    Allergies  Allergen Reactions   Hydrocodone -Acetaminophen  Other (See Comments)    Hallucinations.   Objective:  There were no vitals filed for this visit. There is no height or weight on file to calculate BMI. Constitutional Well developed. Well nourished.  Vascular Dorsalis pedis pulses palpable  bilaterally. Posterior tibial pulses palpable bilaterally. Capillary refill normal to all digits.  No cyanosis or clubbing noted. Pedal hair growth normal.  Neurologic Normal speech. Oriented to person, place, and time. Epicritic sensation to light touch grossly present bilaterally.  Dermatologic Painful ingrowing nail at medial nail borders of the hallux nail bilaterally. No other open wounds. No skin lesions.  Orthopedic: Normal joint ROM without pain or crepitus bilaterally. No visible deformities. No bony tenderness.   Radiographs: None Assessment:  No diagnosis found. Plan:  Patient was evaluated and treated and all questions answered.  Ingrown Nail, r bilateral -Patient elects to proceed with minor surgery to remove ingrown toenail removal today. Consent reviewed and signed by patient. -Ingrown nail excised. See procedure note. -Educated on post-procedure care including soaking. Written instructions provided and reviewed. -Patient to follow up in 2 weeks for nail check.  Procedure: Excision of Ingrown Toenail Location: Bilateral 1st toe medial nail borders. Anesthesia: Lidocaine  1% plain; 1.5 mL and Marcaine  0.5% plain; 1.5 mL, digital block. Skin Prep: Betadine. Dressing: Silvadene; telfa; dry, sterile, compression dressing. Technique: Following skin prep, the toe was exsanguinated and a tourniquet was secured at the base of the toe. The affected nail border was freed, split with a nail splitter, and excised. Chemical matrixectomy was then performed with phenol and irrigated out with alcohol. The tourniquet was then removed and sterile dressing applied. Disposition: Patient tolerated procedure well. Patient to return in 2 weeks for follow-up.   No follow-ups on file.

## 2024-04-06 ENCOUNTER — Other Ambulatory Visit (HOSPITAL_COMMUNITY): Payer: Self-pay

## 2024-04-16 ENCOUNTER — Ambulatory Visit (INDEPENDENT_AMBULATORY_CARE_PROVIDER_SITE_OTHER): Admitting: Podiatry

## 2024-04-16 ENCOUNTER — Ambulatory Visit: Admitting: Podiatry

## 2024-04-16 ENCOUNTER — Encounter: Payer: Self-pay | Admitting: Podiatry

## 2024-04-16 VITALS — Ht 68.0 in | Wt 260.0 lb

## 2024-04-16 DIAGNOSIS — L600 Ingrowing nail: Secondary | ICD-10-CM | POA: Diagnosis not present

## 2024-04-16 MED ORDER — DOXYCYCLINE HYCLATE 100 MG PO TABS: 100.0000 mg | ORAL_TABLET | Freq: Two times a day (BID) | ORAL | 0 refills | Status: AC

## 2024-04-16 MED ORDER — GENTAMICIN SULFATE 0.1 % EX CREA: 1.0000 | TOPICAL_CREAM | Freq: Two times a day (BID) | CUTANEOUS | 1 refills | Status: AC

## 2024-04-16 NOTE — Progress Notes (Signed)
   Chief Complaint  Patient presents with   Ingrown Toenail    Pt is here due to bilateral great toenails, states she had ingrown removed on 8/20 states still some soreness and tenderness to the toes, states a little puss still is draining from the toenails.     Subjective: 58 y.o. female presents today status post permanent nail avulsion procedure of the medial border of the bilateral great toes that was performed on 03/28/2024 with Dr. Tobie.  She continues to have some pain and tenderness associated to the area.   Past Medical History:  Diagnosis Date   Acute meniscal tear of knee left knee   Arthritis KNEES AND FEET   History of gastroesophageal reflux (GERD)    Lumbar disc herniation with radiculopathy    Plantar fasciitis, bilateral     Objective: Neurovascular status intact.  Skin is warm, dry and supple. Nail and respective nail fold appears to be stable however there is some scant drainage to the medial aspect of the right great toe  Assessment: #1 s/p partial permanent nail matrixectomy medial border bilateral great toes.  Dr. Tobie.  03/28/2024   Plan of care: -Patient was evaluated  -Light debridement of the periungual debris was performed to the border of the respective toe and nail plate using a tissue nipper. -Continue soaking in warm water and Epsom salt -Prescription for gentamicin  cream applied twice daily -Prescription for doxycycline  100 mg twice daily x 10 days -Return to clinic in 3 weeks with Dr. Tobie Thresa EMERSON Janit, DPM [facility]  Dr. Thresa EMERSON Janit, DPM    [address] [place], [place] [address]                Office [phone]  Fax [phone]

## 2024-04-24 ENCOUNTER — Other Ambulatory Visit (HOSPITAL_COMMUNITY): Payer: Self-pay

## 2024-04-24 MED ORDER — OXYCODONE HCL 15 MG PO TABS
15.0000 mg | ORAL_TABLET | Freq: Four times a day (QID) | ORAL | 0 refills | Status: DC
  Filled 2024-04-24 (×2): qty 120, 30d supply, fill #0

## 2024-04-24 MED ORDER — METHOCARBAMOL 500 MG PO TABS
500.0000 mg | ORAL_TABLET | Freq: Three times a day (TID) | ORAL | 2 refills | Status: AC
  Filled 2024-04-24: qty 90, 30d supply, fill #0
  Filled 2024-07-03: qty 90, 30d supply, fill #1
  Filled 2024-08-14: qty 90, 30d supply, fill #2
  Filled 2024-09-07: qty 90, 30d supply, fill #0

## 2024-04-24 MED ORDER — NALOXONE HCL 4 MG/0.1ML NA LIQD
NASAL | 4 refills | Status: AC
  Filled 2024-04-24: qty 2, 1d supply, fill #0

## 2024-05-09 ENCOUNTER — Ambulatory Visit: Admitting: Podiatry

## 2024-05-17 ENCOUNTER — Other Ambulatory Visit (HOSPITAL_COMMUNITY): Payer: Self-pay

## 2024-05-17 MED ORDER — OXYCODONE HCL 15 MG PO TABS
15.0000 mg | ORAL_TABLET | Freq: Four times a day (QID) | ORAL | 0 refills | Status: DC
  Filled 2024-05-17 – 2024-05-25 (×6): qty 120, 30d supply, fill #0

## 2024-05-21 ENCOUNTER — Other Ambulatory Visit (HOSPITAL_COMMUNITY): Payer: Self-pay

## 2024-05-21 ENCOUNTER — Other Ambulatory Visit (HOSPITAL_BASED_OUTPATIENT_CLINIC_OR_DEPARTMENT_OTHER): Payer: Self-pay

## 2024-05-22 ENCOUNTER — Other Ambulatory Visit (HOSPITAL_COMMUNITY): Payer: Self-pay

## 2024-05-22 ENCOUNTER — Other Ambulatory Visit: Payer: Self-pay

## 2024-05-23 ENCOUNTER — Other Ambulatory Visit (HOSPITAL_COMMUNITY): Payer: Self-pay

## 2024-05-23 ENCOUNTER — Other Ambulatory Visit: Payer: Self-pay

## 2024-05-23 ENCOUNTER — Other Ambulatory Visit (HOSPITAL_BASED_OUTPATIENT_CLINIC_OR_DEPARTMENT_OTHER): Payer: Self-pay

## 2024-05-25 ENCOUNTER — Other Ambulatory Visit (HOSPITAL_COMMUNITY): Payer: Self-pay

## 2024-05-25 ENCOUNTER — Other Ambulatory Visit: Payer: Self-pay

## 2024-05-25 ENCOUNTER — Other Ambulatory Visit (HOSPITAL_BASED_OUTPATIENT_CLINIC_OR_DEPARTMENT_OTHER): Payer: Self-pay

## 2024-06-18 ENCOUNTER — Other Ambulatory Visit (HOSPITAL_COMMUNITY): Payer: Self-pay

## 2024-06-18 MED ORDER — OXYCODONE HCL 15 MG PO TABS
15.0000 mg | ORAL_TABLET | Freq: Four times a day (QID) | ORAL | 0 refills | Status: AC
  Filled 2024-06-18 – 2024-06-22 (×2): qty 120, 30d supply, fill #0

## 2024-06-19 ENCOUNTER — Other Ambulatory Visit (HOSPITAL_COMMUNITY): Payer: Self-pay

## 2024-06-22 ENCOUNTER — Other Ambulatory Visit (HOSPITAL_COMMUNITY): Payer: Self-pay

## 2024-06-26 ENCOUNTER — Other Ambulatory Visit: Payer: Self-pay

## 2024-07-04 ENCOUNTER — Other Ambulatory Visit (HOSPITAL_COMMUNITY): Payer: Self-pay

## 2024-07-10 ENCOUNTER — Other Ambulatory Visit (HOSPITAL_BASED_OUTPATIENT_CLINIC_OR_DEPARTMENT_OTHER): Payer: Self-pay

## 2024-07-10 MED ORDER — OXYCODONE HCL 15 MG PO TABS
15.0000 mg | ORAL_TABLET | Freq: Four times a day (QID) | ORAL | 0 refills | Status: DC
  Filled 2024-07-20: qty 82, 20d supply, fill #0
  Filled 2024-07-20: qty 38, 10d supply, fill #0

## 2024-07-18 ENCOUNTER — Other Ambulatory Visit (HOSPITAL_BASED_OUTPATIENT_CLINIC_OR_DEPARTMENT_OTHER): Payer: Self-pay

## 2024-07-20 ENCOUNTER — Other Ambulatory Visit (HOSPITAL_BASED_OUTPATIENT_CLINIC_OR_DEPARTMENT_OTHER): Payer: Self-pay

## 2024-08-01 ENCOUNTER — Ambulatory Visit (INDEPENDENT_AMBULATORY_CARE_PROVIDER_SITE_OTHER): Admitting: Podiatry

## 2024-08-01 ENCOUNTER — Encounter: Payer: Self-pay | Admitting: Podiatry

## 2024-08-01 VITALS — Ht 68.0 in | Wt 260.0 lb

## 2024-08-01 DIAGNOSIS — L600 Ingrowing nail: Secondary | ICD-10-CM

## 2024-08-14 ENCOUNTER — Other Ambulatory Visit (HOSPITAL_COMMUNITY): Payer: Self-pay

## 2024-08-14 ENCOUNTER — Other Ambulatory Visit (HOSPITAL_BASED_OUTPATIENT_CLINIC_OR_DEPARTMENT_OTHER): Payer: Self-pay

## 2024-08-15 ENCOUNTER — Other Ambulatory Visit (HOSPITAL_BASED_OUTPATIENT_CLINIC_OR_DEPARTMENT_OTHER): Payer: Self-pay

## 2024-08-15 MED ORDER — OXYCODONE HCL 15 MG PO TABS
15.0000 mg | ORAL_TABLET | Freq: Four times a day (QID) | ORAL | 0 refills | Status: AC
  Filled 2024-08-17: qty 28, 7d supply, fill #0
  Filled 2024-08-17: qty 120, 30d supply, fill #0
  Filled 2024-08-20 – 2024-08-23 (×2): qty 92, 23d supply, fill #1

## 2024-08-17 ENCOUNTER — Other Ambulatory Visit (HOSPITAL_BASED_OUTPATIENT_CLINIC_OR_DEPARTMENT_OTHER): Payer: Self-pay

## 2024-08-17 ENCOUNTER — Other Ambulatory Visit: Payer: Self-pay

## 2024-08-17 MED ORDER — TOPIRAMATE 50 MG PO TABS
50.0000 mg | ORAL_TABLET | Freq: Every day | ORAL | 0 refills | Status: AC
  Filled 2024-08-17: qty 90, 90d supply, fill #0

## 2024-08-17 MED ORDER — LISDEXAMFETAMINE DIMESYLATE 70 MG PO CAPS
70.0000 mg | ORAL_CAPSULE | Freq: Every morning | ORAL | 0 refills | Status: AC
  Filled 2024-08-17: qty 30, 30d supply, fill #0

## 2024-08-17 MED ORDER — MOUNJARO 10 MG/0.5ML ~~LOC~~ SOAJ
10.0000 mg | SUBCUTANEOUS | 0 refills | Status: DC
  Filled 2024-08-17: qty 2, 28d supply, fill #0

## 2024-08-17 MED ORDER — GABAPENTIN 300 MG PO CAPS
300.0000 mg | ORAL_CAPSULE | Freq: Two times a day (BID) | ORAL | 0 refills | Status: AC
  Filled 2024-08-17 – 2024-09-12 (×2): qty 60, 30d supply, fill #0

## 2024-08-17 MED ORDER — LIDOCAINE 5 % EX PTCH
1.0000 | MEDICATED_PATCH | Freq: Every day | CUTANEOUS | 3 refills | Status: AC
  Filled 2024-08-17: qty 30, 30d supply, fill #0

## 2024-08-17 MED ORDER — CYCLOBENZAPRINE HCL 10 MG PO TABS
10.0000 mg | ORAL_TABLET | Freq: Two times a day (BID) | ORAL | 0 refills | Status: AC | PRN
  Filled 2024-08-17: qty 180, 90d supply, fill #0

## 2024-08-17 MED ORDER — METOPROLOL TARTRATE 50 MG PO TABS
50.0000 mg | ORAL_TABLET | Freq: Two times a day (BID) | ORAL | 0 refills | Status: AC
  Filled 2024-08-17: qty 180, 90d supply, fill #0

## 2024-08-17 MED ORDER — IBUPROFEN 800 MG PO TABS
800.0000 mg | ORAL_TABLET | Freq: Three times a day (TID) | ORAL | 0 refills | Status: AC | PRN
  Filled 2024-08-17: qty 270, 90d supply, fill #0

## 2024-08-17 MED ORDER — LEVOTHYROXINE SODIUM 50 MCG PO TABS
50.0000 ug | ORAL_TABLET | Freq: Every morning | ORAL | 0 refills | Status: AC
  Filled 2024-08-17: qty 90, 90d supply, fill #0

## 2024-08-17 MED ORDER — VITAMIN D (ERGOCALCIFEROL) 1.25 MG (50000 UNIT) PO CAPS
50000.0000 [IU] | ORAL_CAPSULE | ORAL | 0 refills | Status: AC
  Filled 2024-08-17: qty 8, 28d supply, fill #0
  Filled 2024-09-07: qty 8, 28d supply, fill #1

## 2024-08-17 MED ORDER — AMITRIPTYLINE HCL 25 MG PO TABS
25.0000 mg | ORAL_TABLET | Freq: Every day | ORAL | 0 refills | Status: AC
  Filled 2024-08-17: qty 30, 30d supply, fill #0

## 2024-08-17 MED ORDER — DULOXETINE HCL 30 MG PO CPEP
30.0000 mg | ORAL_CAPSULE | Freq: Every day | ORAL | 0 refills | Status: AC
  Filled 2024-08-17: qty 90, 90d supply, fill #0

## 2024-08-20 ENCOUNTER — Other Ambulatory Visit (HOSPITAL_BASED_OUTPATIENT_CLINIC_OR_DEPARTMENT_OTHER): Payer: Self-pay

## 2024-08-20 NOTE — Progress Notes (Signed)
" ° °  Chief Complaint  Patient presents with   Ingrown Toenail    Pt is here to f/u on bilateral great toenails, had ingrown removed previously states some pain to both of the toes, states tender to touch.    Subjective: 59 y.o. female presents today status post permanent nail avulsion procedure of the medial border of the bilateral great toes that was performed on 03/28/2024 with Dr. Tobie.  She continues to have some very slight pain and tenderness associated to the area.   Past Medical History:  Diagnosis Date   Acute meniscal tear of knee left knee   Arthritis KNEES AND FEET   History of gastroesophageal reflux (GERD)    Lumbar disc herniation with radiculopathy    Plantar fasciitis, bilateral     Objective: Neurovascular status intact.  Skin is warm, dry and supple. Nail and respective nail fold appears to be stable however there is some scant drainage to the medial aspect of the right great toe  Assessment: #1 s/p partial permanent nail matrixectomy medial border bilateral great toes.  Dr. Tobie.  03/28/2024   Plan of care: -Patient was evaluated  - Again today, light debridement was performed today of the offending nail matricectomy sites to clean the debris and callus tissue.  Patient felt relief -Recommend good shoes that do not irritate or constrict the toebox area -Return to clinic PRN   Thresa EMERSON Sar, DPM [facility]  Dr. Thresa EMERSON Sar, DPM    [address] [place], [place] [address]                Office [phone]  Fax [phone]      "

## 2024-08-21 ENCOUNTER — Other Ambulatory Visit (HOSPITAL_BASED_OUTPATIENT_CLINIC_OR_DEPARTMENT_OTHER): Payer: Self-pay

## 2024-08-22 ENCOUNTER — Other Ambulatory Visit (HOSPITAL_BASED_OUTPATIENT_CLINIC_OR_DEPARTMENT_OTHER): Payer: Self-pay

## 2024-08-23 ENCOUNTER — Other Ambulatory Visit (HOSPITAL_BASED_OUTPATIENT_CLINIC_OR_DEPARTMENT_OTHER): Payer: Self-pay

## 2024-08-23 ENCOUNTER — Ambulatory Visit

## 2024-08-23 ENCOUNTER — Ambulatory Visit (INDEPENDENT_AMBULATORY_CARE_PROVIDER_SITE_OTHER): Admitting: Podiatry

## 2024-08-23 DIAGNOSIS — L84 Corns and callosities: Secondary | ICD-10-CM | POA: Diagnosis not present

## 2024-08-23 DIAGNOSIS — L609 Nail disorder, unspecified: Secondary | ICD-10-CM | POA: Diagnosis not present

## 2024-08-23 DIAGNOSIS — M7751 Other enthesopathy of right foot: Secondary | ICD-10-CM | POA: Diagnosis not present

## 2024-08-23 NOTE — Progress Notes (Signed)
 "   Chief Complaint  Patient presents with   Nail Problem    Right hallux, toe tip is very sensitive. She stated she had bone spurs that were cut out by Dr. Malvin in Jan 2024. She is having pain in the same area. Dr. Janit had told her to use moisturizer and anti fungal cream on the this toe as well.  Not diabetic A1c was 5.0. on GLP1 for weight loss.  No anti coag.    Discussed the use of AI scribe software for clinical note transcription with the patient, who gave verbal consent to proceed.  History of Present Illness Lauren Keith is a 59 year old female with prior bone spur and ingrown toenail procedures who presents with persistent right great toe tenderness.  She reports ongoing marked tenderness and hypersensitivity localized to the tip of the right great toe, specifically at the distal nail margin. The discomfort is severe, with even minimal contact such as a bedsheet causing significant pain and sleep disturbance. Symptoms have persisted despite use of emollient and antidepressant cream as previously recommended for nail bed moisturization.  She previously underwent bone spur removal at the distal toes, which was uncomplicated but required multiple follow-up visits for persistent postoperative bleeding. Both toenails subsequently developed ingrown changes, requiring further intervention with nail excision, local injections, and topical medication to prevent regrowth.  On December 24th, she underwent repeat nail excision of the right great toe. Since that procedure, the nail has not regrown, but the distal toe remains tender without improvement. She has ensured proper footwear to minimize external pressure. The left toe is currently asymptomatic.  She expresses concern about the ongoing pain and wonders if a bone spur could be involved, noting that persistent foot pain significantly impairs her well-being and sleep.     Past Medical History:  Diagnosis Date   Acute meniscal  tear of knee left knee   Arthritis KNEES AND FEET   History of gastroesophageal reflux (GERD)    Lumbar disc herniation with radiculopathy    Plantar fasciitis, bilateral    Past Surgical History:  Procedure Laterality Date   APPENDECTOMY  2010   CHOLECYSTECTOMY  2009   KNEE ARTHROSCOPY  02/25/2012   Procedure: ARTHROSCOPY KNEE;  Surgeon: Reyes JAYSON Billing, MD;  Location[facility];  Service: Orthopedics;  Laterality: Right;  WITH DEBRIDEMENT AND PARTIAL LATERAL MENISECTOMY AND DRAINAGE OF CYST   KNEE ARTHROSCOPY WITH LATERAL MENISECTOMY Left 02/07/2015   Procedure: KNEE ARTHROSCOPY WITH PARTIAL LATERAL MENISECTOMY;  Surgeon: Lamar Millman, MD;  Location: [facility];  Service: Orthopedics;  Laterality: Left;   KNEE ARTHROSCOPY WITH MEDIAL MENISECTOMY Left 02/07/2015   Procedure: KNEE ARTHROSCOPY WITH PARTIAL MEDIAL MENISECTOMY;  Surgeon: Lamar Millman, MD;  Location: [facility];  Service: Orthopedics;  Laterality: Left;   LAPAROSCOPIC GASTRIC BANDING  04/2007   [place]   AP[facility];   AND PORT REMOVAL/ REPLACEMENT 2009   LAPAROSCOPIC REPAIR AND REMOVAL OF GASTRIC BAND  12/2012   [facility]band removal at [facility]   TOTAL ABDOMINAL HYSTERECTOMY W/ BILATERAL SALPINGOOPHORECTOMY  2007   W/ ANTERIOR AND POSTERIOR REPAIR   TUBAL LIGATION  2002   Allergies[1]  Physical Exam Palpable pedal pulses.  Tenderness at the tip of the right toe near the nail bed.  There is a small focal hyperkeratotic lesion at the distal-dorsal portion of the nail at the hyponychium area.  There is also some thickening of the toenail present at the hyponychium.  No surrounding erythema or foreign body is noted   Results Radiology Right foot radiograph (08/23/2024): No evidence of distal phalanx bone spur recurrence; normal osseous morphology;  (Independently interpreted)  Nail debridement with sanding burr Thickened nail plate of right toe filed with sanding burr to reduce thickness. No  bleeding or acute complications during procedure.  Topical palliative care with salinocaine and dressing application The corn was shaved with a sterile #313 blade, followed by application of Salinocaine cream paste applied to affected area of right toe and covered with adhesive bandage.    Assessment/Plan of Care: 1. Corns   2. Bone spur of toe of right foot   3. Abnormality of nail surface     Assessment & Plan Right great toe pain and tenderness status post bone spur removal with recurrent nail and skin changes Persistent severe pain and tenderness at the tip of the right great toe following prior bone spur removal and multiple nail procedures. Imaging today showed no recurrent bone spur or bony abnormality. Symptoms are localized to the nail and adjacent skin, likely secondary to post-procedural changes and keratin buildup. Pain disrupts sleep and is exacerbated by light contact. Prior interventions have been ineffective. - Obtained right great toe x-ray and compared to prior imaging to rule out recurrent bone spur. - Performed clinical examination of nail and skin. - Reduced nail thickness with sanding burr to decrease contributing factors. - Applied salinocaine cream paste to the affected area and covered with adhesive bandage until bedtime. - Recommended initiation of Kera Nail Urea Nail Gel (=45% urea, preferably 50%) to be applied once daily at bedtime to the front edge of the nail and affected skin area. - Provided instructions to begin urea gel application once available.  Corn of right great toe Corn at the tip of the right great toe, localized to the area of maximal tenderness. Likely contributing to pain and sensitivity, associated with keratin buildup and post-surgical changes in the nail and skin. - Recommended Cara Nail Urea Nail Gel (=45% urea) for targeted treatment of the corn and keratin buildup. - Corn was shaved with a sterile #313 blade.  Reduced nail thickness with  sanding burr to minimize pressure and irritation at the site of the corn. - Applied salinocaine cream paste to the corn and covered with adhesive bandage until bedtime.  Follow-up as needed  Awanda CHARM Imperial, DPM, FACFAS [facility]     [address] [place], [place] [address]                Office [phone]  Fax [phone]      [1]  Allergies Allergen Reactions   Hydrocodone -Acetaminophen  Other (See Comments)    Hallucinations.   "

## 2024-08-28 ENCOUNTER — Other Ambulatory Visit: Payer: Self-pay

## 2024-08-28 ENCOUNTER — Other Ambulatory Visit (HOSPITAL_BASED_OUTPATIENT_CLINIC_OR_DEPARTMENT_OTHER): Payer: Self-pay

## 2024-08-28 MED ORDER — OFLOXACIN 0.3 % OP SOLN
1.0000 [drp] | Freq: Four times a day (QID) | OPHTHALMIC | 2 refills | Status: AC
  Filled 2024-08-28: qty 5, 25d supply, fill #0

## 2024-08-28 MED ORDER — DIFLUPREDNATE 0.05 % OP EMUL
1.0000 [drp] | Freq: Every day | OPHTHALMIC | 2 refills | Status: AC
  Filled 2024-08-28: qty 5, 100d supply, fill #0

## 2024-08-31 ENCOUNTER — Ambulatory Visit: Admitting: Podiatry

## 2024-09-04 ENCOUNTER — Other Ambulatory Visit (HOSPITAL_BASED_OUTPATIENT_CLINIC_OR_DEPARTMENT_OTHER): Payer: Self-pay

## 2024-09-04 MED ORDER — LISDEXAMFETAMINE DIMESYLATE 70 MG PO CAPS
70.0000 mg | ORAL_CAPSULE | Freq: Every morning | ORAL | 0 refills | Status: AC
  Filled 2024-09-04: qty 30, 30d supply, fill #0

## 2024-09-07 ENCOUNTER — Other Ambulatory Visit (HOSPITAL_BASED_OUTPATIENT_CLINIC_OR_DEPARTMENT_OTHER): Payer: Self-pay

## 2024-09-07 MED ORDER — MOUNJARO 10 MG/0.5ML ~~LOC~~ SOAJ
10.0000 mg | SUBCUTANEOUS | 0 refills | Status: AC
  Filled 2024-09-12: qty 2, 28d supply, fill #0

## 2024-09-10 ENCOUNTER — Other Ambulatory Visit (HOSPITAL_BASED_OUTPATIENT_CLINIC_OR_DEPARTMENT_OTHER): Payer: Self-pay

## 2024-09-12 ENCOUNTER — Other Ambulatory Visit: Payer: Self-pay

## 2024-09-12 ENCOUNTER — Other Ambulatory Visit (HOSPITAL_BASED_OUTPATIENT_CLINIC_OR_DEPARTMENT_OTHER): Payer: Self-pay
